# Patient Record
Sex: Male | Born: 1940
Health system: Southern US, Community
[De-identification: ages and names within clinical notes are randomized; demographics above are authoritative.]

## PROBLEM LIST (undated history)

## (undated) DIAGNOSIS — E785 Hyperlipidemia, unspecified: Secondary | ICD-10-CM

## (undated) DIAGNOSIS — I10 Essential (primary) hypertension: Secondary | ICD-10-CM

## (undated) DIAGNOSIS — M1990 Unspecified osteoarthritis, unspecified site: Secondary | ICD-10-CM

## (undated) DIAGNOSIS — F419 Anxiety disorder, unspecified: Secondary | ICD-10-CM

## (undated) DIAGNOSIS — I2510 Atherosclerotic heart disease of native coronary artery without angina pectoris: Secondary | ICD-10-CM

## (undated) DIAGNOSIS — Z87442 Personal history of urinary calculi: Secondary | ICD-10-CM

## (undated) DIAGNOSIS — G4730 Sleep apnea, unspecified: Secondary | ICD-10-CM

## (undated) DIAGNOSIS — C61 Malignant neoplasm of prostate: Secondary | ICD-10-CM

## (undated) DIAGNOSIS — M47816 Spondylosis without myelopathy or radiculopathy, lumbar region: Secondary | ICD-10-CM

## (undated) HISTORY — PX: BACK SURGERY: SHX140

## (undated) HISTORY — DX: Malignant neoplasm of prostate: C61

## (undated) HISTORY — PX: ANKLE FRACTURE SURGERY: SHX122

## (undated) HISTORY — PX: PROSTATE SURGERY: SHX751

## (undated) HISTORY — DX: Spondylosis without myelopathy or radiculopathy, lumbar region: M47.816

## (undated) HISTORY — PX: HERNIA REPAIR: SHX51

## (undated) HISTORY — PX: APPENDECTOMY: SHX54

## (undated) HISTORY — DX: Hyperlipidemia, unspecified: E78.5

## (undated) HISTORY — PX: CARDIAC CATHETERIZATION: SHX172

---

## 1998-05-15 DIAGNOSIS — C61 Malignant neoplasm of prostate: Secondary | ICD-10-CM

## 1998-05-15 HISTORY — DX: Malignant neoplasm of prostate: C61

## 1998-12-28 ENCOUNTER — Other Ambulatory Visit: Admission: RE | Admit: 1998-12-28 | Discharge: 1998-12-28 | Payer: Self-pay | Admitting: Urology

## 1999-01-26 ENCOUNTER — Encounter: Payer: Self-pay | Admitting: Urology

## 1999-01-31 ENCOUNTER — Inpatient Hospital Stay (HOSPITAL_COMMUNITY): Admission: RE | Admit: 1999-01-31 | Discharge: 1999-02-03 | Payer: Self-pay | Admitting: Urology

## 2012-10-23 DIAGNOSIS — G603 Idiopathic progressive neuropathy: Secondary | ICD-10-CM | POA: Insufficient documentation

## 2012-11-22 DIAGNOSIS — G4733 Obstructive sleep apnea (adult) (pediatric): Secondary | ICD-10-CM | POA: Insufficient documentation

## 2013-04-17 DIAGNOSIS — M48061 Spinal stenosis, lumbar region without neurogenic claudication: Secondary | ICD-10-CM | POA: Insufficient documentation

## 2014-05-26 DIAGNOSIS — H40013 Open angle with borderline findings, low risk, bilateral: Secondary | ICD-10-CM | POA: Diagnosis not present

## 2014-06-03 DIAGNOSIS — C61 Malignant neoplasm of prostate: Secondary | ICD-10-CM | POA: Diagnosis not present

## 2014-06-08 DIAGNOSIS — G4733 Obstructive sleep apnea (adult) (pediatric): Secondary | ICD-10-CM | POA: Diagnosis not present

## 2014-06-24 DIAGNOSIS — M4806 Spinal stenosis, lumbar region: Secondary | ICD-10-CM | POA: Diagnosis not present

## 2014-06-24 DIAGNOSIS — G603 Idiopathic progressive neuropathy: Secondary | ICD-10-CM | POA: Diagnosis not present

## 2014-06-24 DIAGNOSIS — G4733 Obstructive sleep apnea (adult) (pediatric): Secondary | ICD-10-CM | POA: Diagnosis not present

## 2014-07-09 DIAGNOSIS — G4733 Obstructive sleep apnea (adult) (pediatric): Secondary | ICD-10-CM | POA: Diagnosis not present

## 2014-07-27 DIAGNOSIS — C61 Malignant neoplasm of prostate: Secondary | ICD-10-CM | POA: Diagnosis not present

## 2014-07-27 DIAGNOSIS — N5201 Erectile dysfunction due to arterial insufficiency: Secondary | ICD-10-CM | POA: Diagnosis not present

## 2014-08-07 DIAGNOSIS — G4733 Obstructive sleep apnea (adult) (pediatric): Secondary | ICD-10-CM | POA: Diagnosis not present

## 2014-08-07 DIAGNOSIS — S329XXA Fracture of unspecified parts of lumbosacral spine and pelvis, initial encounter for closed fracture: Secondary | ICD-10-CM | POA: Diagnosis not present

## 2014-08-07 DIAGNOSIS — M353 Polymyalgia rheumatica: Secondary | ICD-10-CM | POA: Diagnosis not present

## 2014-08-07 DIAGNOSIS — M159 Polyosteoarthritis, unspecified: Secondary | ICD-10-CM | POA: Diagnosis not present

## 2014-08-15 DIAGNOSIS — M150 Primary generalized (osteo)arthritis: Secondary | ICD-10-CM | POA: Diagnosis not present

## 2014-08-15 DIAGNOSIS — M353 Polymyalgia rheumatica: Secondary | ICD-10-CM | POA: Diagnosis not present

## 2014-08-15 DIAGNOSIS — G4733 Obstructive sleep apnea (adult) (pediatric): Secondary | ICD-10-CM | POA: Diagnosis not present

## 2014-09-07 DIAGNOSIS — G4733 Obstructive sleep apnea (adult) (pediatric): Secondary | ICD-10-CM | POA: Diagnosis not present

## 2014-09-15 DIAGNOSIS — H6122 Impacted cerumen, left ear: Secondary | ICD-10-CM | POA: Diagnosis not present

## 2014-09-23 DIAGNOSIS — G4733 Obstructive sleep apnea (adult) (pediatric): Secondary | ICD-10-CM | POA: Diagnosis not present

## 2014-09-23 DIAGNOSIS — G603 Idiopathic progressive neuropathy: Secondary | ICD-10-CM | POA: Diagnosis not present

## 2014-09-23 DIAGNOSIS — M4806 Spinal stenosis, lumbar region: Secondary | ICD-10-CM | POA: Diagnosis not present

## 2014-10-07 DIAGNOSIS — G4733 Obstructive sleep apnea (adult) (pediatric): Secondary | ICD-10-CM | POA: Diagnosis not present

## 2014-10-14 DIAGNOSIS — M1612 Unilateral primary osteoarthritis, left hip: Secondary | ICD-10-CM | POA: Diagnosis not present

## 2014-10-29 ENCOUNTER — Ambulatory Visit (INDEPENDENT_AMBULATORY_CARE_PROVIDER_SITE_OTHER): Payer: Medicare Other | Admitting: Sports Medicine

## 2014-10-29 ENCOUNTER — Encounter: Payer: Self-pay | Admitting: Sports Medicine

## 2014-10-29 VITALS — BP 141/86 | HR 91 | Ht 68.0 in | Wt 206.0 lb

## 2014-10-29 DIAGNOSIS — M1612 Unilateral primary osteoarthritis, left hip: Secondary | ICD-10-CM | POA: Diagnosis not present

## 2014-10-29 MED ORDER — MELOXICAM 15 MG PO TABS: ORAL_TABLET | ORAL | Status: DC

## 2014-10-29 NOTE — Progress Notes (Signed)
   Subjective:    I'm seeing this patient as a consultation for:  Dr. Judie Petit [facility] family physicians  CC: Left hip pain  HPI: This is a pleasant 74 year old male, he comes in with a long history of pain that he localizes in the left groin, moderate, persistent without radiation, worse in the mornings. No trauma. He's tried oral medications including allergies except without any improvement. He does bring x-rays that show hip osteoarthritis.  Past medical history, Surgical history, Family history not pertinant except as noted below, Social history, Allergies, and medications have been entered into the medical record, reviewed, and no changes needed.   Review of Systems: No headache, visual changes, nausea, vomiting, diarrhea, constipation, dizziness, abdominal pain, skin rash, fevers, chills, night sweats, weight loss, swollen lymph nodes, body aches, joint swelling, muscle aches, chest pain, shortness of breath, mood changes, visual or auditory hallucinations.   Objective:   General: Well Developed, well nourished, and in no acute distress.  Neuro/Psych: Alert and oriented x3, extra-ocular muscles intact, able to move all 4 extremities, sensation grossly intact. Skin: Warm and dry, no rashes noted.  Respiratory: Not using accessory muscles, speaking in full sentences, trachea midline.  Cardiovascular: Pulses palpable, no extremity edema. Abdomen: Does not appear distended. Left Hip: ROM IR: 10 Deg, ER: 60 Deg, Flexion: 120 Deg, Extension: 100 Deg, Abduction: 45 Deg, Adduction: 45 Deg, internal rotation reproduces pain. Strength IR: 5/5, ER: 5/5, Flexion: 5/5, Extension: 5/5, Abduction: 5/5, Adduction: 5/5 Pelvic alignment unremarkable to inspection and palpation. Standing hip rotation and gait without trendelenburg / unsteadiness. Greater trochanter without tenderness to palpation. No tenderness over piriformis. No SI joint tenderness and normal minimal SI  movement.  Procedure: Real-time Ultrasound Guided Injection of left femoral acetabular joint Device: GE Logiq E  Verbal informed consent obtained.  Time-out conducted.  Noted no overlying erythema, induration, or other signs of local infection.  Skin prepped in a sterile fashion.  Local anesthesia: Topical Ethyl chloride.  With sterile technique and under real time ultrasound guidance:  Spinal needle advanced to the femoral head/neck tension, 1 mL every: 40, 4 mL lidocaine injected easily. Completed without difficulty  Pain immediately resolved suggesting accurate placement of the medication.  Advised to call if fevers/chills, erythema, induration, drainage, or persistent bleeding.  Images permanently stored and available for review in the ultrasound unit.  Impression: Technically successful ultrasound guided injection.  Impression and Recommendations:   This case required medical decision making of moderate complexity.

## 2014-10-29 NOTE — Assessment & Plan Note (Signed)
X-ray confirmed. Advised home rehabilitation exercises, meloxicam, he did desire femoral acetabular injection which was performed today.  Return in one month.

## 2014-11-02 ENCOUNTER — Encounter: Payer: Self-pay | Admitting: Sports Medicine

## 2014-11-07 DIAGNOSIS — G4733 Obstructive sleep apnea (adult) (pediatric): Secondary | ICD-10-CM | POA: Diagnosis not present

## 2014-11-25 ENCOUNTER — Ambulatory Visit (INDEPENDENT_AMBULATORY_CARE_PROVIDER_SITE_OTHER): Payer: Medicare Other | Admitting: Sports Medicine

## 2014-11-25 ENCOUNTER — Encounter: Payer: Self-pay | Admitting: Sports Medicine

## 2014-11-25 VITALS — BP 124/78 | HR 78 | Ht 68.0 in | Wt 207.0 lb

## 2014-11-25 DIAGNOSIS — M1612 Unilateral primary osteoarthritis, left hip: Secondary | ICD-10-CM

## 2014-11-25 NOTE — Assessment & Plan Note (Signed)
Moderate response to previous femoral acetabular injection as above. He needs a repeat injection, if insufficient response he does need a hip arthroplasty. Repeat injection today.

## 2014-11-25 NOTE — Progress Notes (Signed)
  Subjective:    CC: follow-up  HPI: This pleasant 74 year old male returns, he has primary left hip osteoarthritis, we injected the last visit and he did well, approximately 8:30 to 40% improvement in pain, he is amenable to trying a second injection today, pain is moderate, persistent and localized to the left groin, worse with weightbearing.  Past medical history, Surgical history, Family history not pertinant except as noted below, Social history, Allergies, and medications have been entered into the medical record, reviewed, and no changes needed.   Review of Systems: No fevers, chills, night sweats, weight loss, chest pain, or shortness of breath.   Objective:    General: Well Developed, well nourished, and in no acute distress.  Neuro: Alert and oriented x3, extra-ocular muscles intact, sensation grossly intact.  HEENT: Normocephalic, atraumatic, pupils equal round reactive to light, neck supple, no masses, no lymphadenopathy, thyroid nonpalpable.  Skin: Warm and dry, no rashes. Cardiac: Regular rate and rhythm, no murmurs rubs or gallops, no lower extremity edema.  Respiratory: Clear to auscultation bilaterally. Not using accessory muscles, speaking in full sentences. Left Hip: ROM IR: 20 with pain, ER: 60 Deg, Flexion: 120 Deg, Extension: 100 Deg, Abduction: 45 Deg, Adduction: 45 Deg Strength IR: 5/5, ER: 5/5, Flexion: 5/5, Extension: 5/5, Abduction: 5/5, Adduction: 5/5 Pelvic alignment unremarkable to inspection and palpation. Standing hip rotation and gait without trendelenburg / unsteadiness. Greater trochanter without tenderness to palpation. No tenderness over piriformis. No SI joint tenderness and normal minimal SI movement.  Procedure: Real-time Ultrasound Guided Injection of left femoral acetabular joint Device: GE Logiq E  Verbal informed consent obtained.  Time-out conducted.  Noted no overlying erythema, induration, or other signs of local infection.  Skin  prepped in a sterile fashion.  Local anesthesia: Topical Ethyl chloride.  With sterile technique and under real time ultrasound guidance:  Spinal needle advanced into the femoral head/neck junction, 2 mL kenalog 40, 4 mL lidocaine injected easily. Completed without difficulty  Pain immediately resolved suggesting accurate placement of the medication.  Advised to call if fevers/chills, erythema, induration, drainage, or persistent bleeding.  Images permanently stored and available for review in the ultrasound unit.  Impression: Technically successful ultrasound guided injection.  Impression and Recommendations:

## 2014-11-26 ENCOUNTER — Ambulatory Visit: Payer: Medicare Other | Admitting: Sports Medicine

## 2014-12-07 DIAGNOSIS — G4733 Obstructive sleep apnea (adult) (pediatric): Secondary | ICD-10-CM | POA: Diagnosis not present

## 2014-12-18 DIAGNOSIS — K1379 Other lesions of oral mucosa: Secondary | ICD-10-CM | POA: Diagnosis not present

## 2014-12-18 DIAGNOSIS — K1329 Other disturbances of oral epithelium, including tongue: Secondary | ICD-10-CM | POA: Diagnosis not present

## 2014-12-24 ENCOUNTER — Ambulatory Visit (INDEPENDENT_AMBULATORY_CARE_PROVIDER_SITE_OTHER): Payer: Medicare Other | Admitting: Sports Medicine

## 2014-12-24 ENCOUNTER — Encounter: Payer: Self-pay | Admitting: Sports Medicine

## 2014-12-24 DIAGNOSIS — M1612 Unilateral primary osteoarthritis, left hip: Secondary | ICD-10-CM | POA: Diagnosis not present

## 2014-12-24 MED ORDER — TRAMADOL HCL 50 MG PO TABS: ORAL_TABLET | ORAL | Status: DC

## 2014-12-24 NOTE — Assessment & Plan Note (Signed)
50% better after 2 femoroacetabular injections under ultrasound guidance. Adding tramadol, and referral to Dr. Mayer Camel to consider joint replacement

## 2014-12-24 NOTE — Progress Notes (Signed)
  Subjective:    CC: follow-up  HPI: Chad Smith has left knee osteoarthritis, we have done 2 injections into the joint,Overall he's done well, without 50% improvement in pain, he also does tramadol, gabapentin, and has done rehabilitation. Unfortunately his pain relief is not sufficient, and he is amenable at this point to consider arthroplasty.  Past medical history, Surgical history, Family history not pertinant except as noted below, Social history, Allergies, and medications have been entered into the medical record, reviewed, and no changes needed.   Review of Systems: No fevers, chills, night sweats, weight loss, chest pain, or shortness of breath.   Objective:    General: Well Developed, well nourished, and in no acute distress.  Neuro: Alert and oriented x3, extra-ocular muscles intact, sensation grossly intact.  HEENT: Normocephalic, atraumatic, pupils equal round reactive to light, neck supple, no masses, no lymphadenopathy, thyroid nonpalpable.  Skin: Warm and dry, no rashes. Cardiac: Regular rate and rhythm, no murmurs rubs or gallops, no lower extremity edema.  Respiratory: Clear to auscultation bilaterally. Not using accessory muscles, speaking in full sentences. Left hip: Tender to palpation with reproduction of pain on internal rotation, he does lack a good 30 of internal rotation, we can also reproduce his pain with flexion of the hip.  Impression and Recommendations:    I spent 40 minutes with this patient, greater than 50% was face-to-face time counseling regarding the above diagnoses, and a great deal of time discussing the natural history and progression of osteoarthritis.

## 2015-01-05 DIAGNOSIS — M1612 Unilateral primary osteoarthritis, left hip: Secondary | ICD-10-CM | POA: Diagnosis not present

## 2015-01-05 DIAGNOSIS — L439 Lichen planus, unspecified: Secondary | ICD-10-CM | POA: Diagnosis not present

## 2015-01-05 DIAGNOSIS — F5104 Psychophysiologic insomnia: Secondary | ICD-10-CM | POA: Diagnosis not present

## 2015-01-06 DIAGNOSIS — G4733 Obstructive sleep apnea (adult) (pediatric): Secondary | ICD-10-CM | POA: Diagnosis not present

## 2015-01-06 DIAGNOSIS — M150 Primary generalized (osteo)arthritis: Secondary | ICD-10-CM | POA: Diagnosis not present

## 2015-01-06 DIAGNOSIS — M353 Polymyalgia rheumatica: Secondary | ICD-10-CM | POA: Diagnosis not present

## 2015-01-07 DIAGNOSIS — G4733 Obstructive sleep apnea (adult) (pediatric): Secondary | ICD-10-CM | POA: Diagnosis not present

## 2015-01-08 DIAGNOSIS — Z125 Encounter for screening for malignant neoplasm of prostate: Secondary | ICD-10-CM | POA: Diagnosis not present

## 2015-01-11 DIAGNOSIS — L438 Other lichen planus: Secondary | ICD-10-CM | POA: Diagnosis not present

## 2015-01-19 DIAGNOSIS — L439 Lichen planus, unspecified: Secondary | ICD-10-CM | POA: Diagnosis not present

## 2015-01-21 DIAGNOSIS — M1612 Unilateral primary osteoarthritis, left hip: Secondary | ICD-10-CM | POA: Diagnosis not present

## 2015-01-26 ENCOUNTER — Other Ambulatory Visit: Payer: Self-pay | Admitting: Orthopedic Surgery

## 2015-02-01 DIAGNOSIS — C61 Malignant neoplasm of prostate: Secondary | ICD-10-CM | POA: Diagnosis not present

## 2015-02-04 DIAGNOSIS — M150 Primary generalized (osteo)arthritis: Secondary | ICD-10-CM | POA: Diagnosis not present

## 2015-02-04 DIAGNOSIS — M353 Polymyalgia rheumatica: Secondary | ICD-10-CM | POA: Diagnosis not present

## 2015-02-09 DIAGNOSIS — Z23 Encounter for immunization: Secondary | ICD-10-CM | POA: Diagnosis not present

## 2015-02-11 DIAGNOSIS — C61 Malignant neoplasm of prostate: Secondary | ICD-10-CM | POA: Diagnosis not present

## 2015-02-17 DIAGNOSIS — L438 Other lichen planus: Secondary | ICD-10-CM | POA: Diagnosis not present

## 2015-02-24 ENCOUNTER — Encounter (HOSPITAL_COMMUNITY)
Admission: RE | Admit: 2015-02-24 | Discharge: 2015-02-24 | Disposition: A | Discharge status: Home / Self Care | Payer: Medicare Other | Source: Ambulatory Visit | Attending: Orthopedic Surgery | Admitting: Orthopedic Surgery

## 2015-02-24 ENCOUNTER — Encounter (HOSPITAL_COMMUNITY): Payer: Self-pay

## 2015-02-24 DIAGNOSIS — Z01818 Encounter for other preprocedural examination: Secondary | ICD-10-CM | POA: Diagnosis not present

## 2015-02-24 DIAGNOSIS — G4730 Sleep apnea, unspecified: Secondary | ICD-10-CM | POA: Insufficient documentation

## 2015-02-24 DIAGNOSIS — M1612 Unilateral primary osteoarthritis, left hip: Secondary | ICD-10-CM | POA: Diagnosis not present

## 2015-02-24 DIAGNOSIS — I10 Essential (primary) hypertension: Secondary | ICD-10-CM | POA: Diagnosis not present

## 2015-02-24 DIAGNOSIS — Z0183 Encounter for blood typing: Secondary | ICD-10-CM | POA: Diagnosis not present

## 2015-02-24 DIAGNOSIS — Z01812 Encounter for preprocedural laboratory examination: Secondary | ICD-10-CM | POA: Insufficient documentation

## 2015-02-24 HISTORY — DX: Unspecified osteoarthritis, unspecified site: M19.90

## 2015-02-24 HISTORY — DX: Essential (primary) hypertension: I10

## 2015-02-24 HISTORY — DX: Sleep apnea, unspecified: G47.30

## 2015-02-24 HISTORY — DX: Anxiety disorder, unspecified: F41.9

## 2015-02-24 LAB — CBC WITH DIFFERENTIAL/PLATELET
BASOS PCT: 1 %
Basophils Absolute: 0 10*3/uL (ref 0.0–0.1)
EOS ABS: 0.3 10*3/uL (ref 0.0–0.7)
Eosinophils Relative: 4 %
HCT: 45.2 % (ref 39.0–52.0)
Hemoglobin: 15.4 g/dL (ref 13.0–17.0)
Lymphocytes Relative: 24 %
Lymphs Abs: 1.4 10*3/uL (ref 0.7–4.0)
MCH: 30.9 pg (ref 26.0–34.0)
MCHC: 34.1 g/dL (ref 30.0–36.0)
MCV: 90.6 fL (ref 78.0–100.0)
MONO ABS: 0.5 10*3/uL (ref 0.1–1.0)
MONOS PCT: 9 %
NEUTROS PCT: 62 %
Neutro Abs: 3.8 10*3/uL (ref 1.7–7.7)
PLATELETS: 245 10*3/uL (ref 150–400)
RBC: 4.99 MIL/uL (ref 4.22–5.81)
RDW: 13.6 % (ref 11.5–15.5)
WBC: 6.1 10*3/uL (ref 4.0–10.5)

## 2015-02-24 LAB — SURGICAL PCR SCREEN
MRSA, PCR: NEGATIVE
STAPHYLOCOCCUS AUREUS: NEGATIVE

## 2015-02-24 LAB — URINALYSIS, ROUTINE W REFLEX MICROSCOPIC
BILIRUBIN URINE: NEGATIVE
Glucose, UA: NEGATIVE mg/dL
HGB URINE DIPSTICK: NEGATIVE
KETONES UR: NEGATIVE mg/dL
Leukocytes, UA: NEGATIVE
Nitrite: NEGATIVE
PROTEIN: NEGATIVE mg/dL
Specific Gravity, Urine: 1.016 (ref 1.005–1.030)
UROBILINOGEN UA: 0.2 mg/dL (ref 0.0–1.0)
pH: 7.5 (ref 5.0–8.0)

## 2015-02-24 LAB — BASIC METABOLIC PANEL
Anion gap: 8 (ref 5–15)
BUN: 12 mg/dL (ref 6–20)
CALCIUM: 9.5 mg/dL (ref 8.9–10.3)
CO2: 28 mmol/L (ref 22–32)
CREATININE: 0.96 mg/dL (ref 0.61–1.24)
Chloride: 99 mmol/L — ABNORMAL LOW (ref 101–111)
GFR calc non Af Amer: >60 mL/min (ref >60)
GLUCOSE: 98 mg/dL (ref 65–99)
Potassium: 4.4 mmol/L (ref 3.5–5.1)
Sodium: 135 mmol/L (ref 135–145)

## 2015-02-24 LAB — TYPE AND SCREEN
ABO/RH(D): A POS
ANTIBODY SCREEN: NEGATIVE

## 2015-02-24 LAB — PROTIME-INR
INR: 0.94 (ref 0.00–1.49)
PROTHROMBIN TIME: 12.8 s (ref 11.6–15.2)

## 2015-02-24 LAB — ABO/RH: ABO/RH(D): A POS

## 2015-02-24 LAB — APTT: aPTT: 27 seconds (ref 24–37)

## 2015-03-02 NOTE — H&P (Signed)
TOTAL HIP ADMISSION H&P  Patient is admitted for left total hip arthroplasty.  Subjective:  Chief Complaint: left hip pain  HPI: Chad Smith, 74 y.o. male, has a history of pain and functional disability in the left hip(s) due to arthritis and patient has failed non-surgical conservative treatments for greater than 12 weeks to include NSAID's and/or analgesics, corticosteriod injections, flexibility and strengthening excercises, weight reduction as appropriate and activity modification.  Onset of symptoms was gradual starting 3 years ago with gradually worsening course since that time.The patient noted no past surgery on the left hip(s).  Patient currently rates pain in the left hip at 10 out of 10 with activity. Patient has night pain, worsening of pain with activity and weight bearing, pain that interfers with activities of daily living and pain with passive range of motion. Patient has evidence of subchondral cysts and joint space narrowing by imaging studies. This condition presents safety issues increasing the risk of falls.  There is no current active infection.  Patient Active Problem List   Diagnosis Date Noted  . Primary osteoarthritis of left hip 10/29/2014   Past Medical History  Diagnosis Date  . Hypertension   . Arthritis   . Sleep apnea     v pap  . Anxiety     Past Surgical History  Procedure Laterality Date  . Back surgery    . Hernia repair    . Appendectomy    . Ankle fracture surgery    . Prostate surgery      No prescriptions prior to admission   No Known Allergies  Social History  Substance Use Topics  . Smoking status: Never Smoker   . Smokeless tobacco: Not on file  . Alcohol Use: 0.0 oz/week    0 Standard drinks or equivalent per week     Comment: occ    No family history on file.   Review of Systems  Constitutional: Negative.   HENT: Negative.   Eyes: Negative.   Respiratory: Negative.   Cardiovascular: Negative.   Gastrointestinal:  Negative.   Genitourinary: Negative.   Musculoskeletal: Positive for joint pain.  Skin: Negative.   Neurological: Negative.   Endo/Heme/Allergies: Negative.   Psychiatric/Behavioral: Negative.     Objective:  Physical Exam  Constitutional: He is oriented to person, place, and time. He appears well-developed and well-nourished.  HENT:  Head: Normocephalic and atraumatic.  Eyes: Pupils are equal, round, and reactive to light.  Neck: Normal range of motion. Neck supple.  Cardiovascular: Intact distal pulses.   Respiratory: Effort normal.  Musculoskeletal: He exhibits tenderness.  the patient's right hip has good strength good range of motion and no pain.  Patient's left hip has obvious irritability with hip flexion as well as internal rotation.  Severely limited internal rotation to approximately 10-15.  Pain with palpation of the groin and lateral hip.  Positive heel bump.  Patient's calves are soft and nontender.  He is neurovascularly intact distally.  Neurological: He is alert and oriented to person, place, and time.  Skin: Skin is warm and dry.  Psychiatric: He has a normal mood and affect. His behavior is normal. Judgment and thought content normal.    Vital signs in last 24 hours:    Labs:   Estimated body mass index is 31.48 kg/(m^2) as calculated from the following:   Height as of 11/25/14: 5\' 8"  (1.727 m).   Weight as of 12/24/14: 93.895 kg (207 lb).   Imaging Review Plain radiographs demonstrate end-stage  arthritis of the left hip with large subchondral cysts superiorly.  Assessment/Plan:  End stage arthritis, left hip(s)  The patient history, physical examination, clinical judgement of the provider and imaging studies are consistent with end stage degenerative joint disease of the left hip(s) and total hip arthroplasty is deemed medically necessary. The treatment options including medical management, injection therapy, arthroscopy and arthroplasty were discussed at  length. The risks and benefits of total hip arthroplasty were presented and reviewed. The risks due to aseptic loosening, infection, stiffness, dislocation/subluxation,  thromboembolic complications and other imponderables were discussed.  The patient acknowledged the explanation, agreed to proceed with the plan and consent was signed. Patient is being admitted for inpatient treatment for surgery, pain control, PT, OT, prophylactic antibiotics, VTE prophylaxis, progressive ambulation and ADL's and discharge planning.The patient is planning to be discharged home with home health services

## 2015-03-05 NOTE — Progress Notes (Signed)
Patient notified of time change and informed to arrive to [facility] day of surgery at 7:30.  Patient verbalized understanding.

## 2015-03-07 MED ORDER — CEFAZOLIN SODIUM-DEXTROSE 2-3 GM-% IV SOLR
2.0000 g | INTRAVENOUS | Status: AC
  Administered 2015-03-08: 2 g via INTRAVENOUS
  Filled 2015-03-07: qty 50

## 2015-03-07 MED ORDER — CHLORHEXIDINE GLUCONATE 4 % EX LIQD: 60.0000 mL | Freq: Once | CUTANEOUS | Status: DC

## 2015-03-07 MED ORDER — DEXTROSE-NACL 5-0.45 % IV SOLN: INTRAVENOUS | Status: DC

## 2015-03-08 ENCOUNTER — Encounter (HOSPITAL_COMMUNITY): Payer: Self-pay | Admitting: Surgery

## 2015-03-08 ENCOUNTER — Inpatient Hospital Stay (HOSPITAL_COMMUNITY): Payer: Medicare Other | Admitting: Anesthesiology

## 2015-03-08 ENCOUNTER — Inpatient Hospital Stay (HOSPITAL_COMMUNITY): Payer: Medicare Other

## 2015-03-08 ENCOUNTER — Inpatient Hospital Stay (HOSPITAL_COMMUNITY)
Admission: RE | Admit: 2015-03-08 | Discharge: 2015-03-10 | DRG: 470 | Disposition: A | Discharge status: Home Health | Payer: Medicare Other | Source: Ambulatory Visit | Attending: Orthopedic Surgery | Admitting: Orthopedic Surgery

## 2015-03-08 ENCOUNTER — Encounter (HOSPITAL_COMMUNITY)
Admission: RE | Disposition: A | Discharge status: Home Health | Payer: Self-pay | Source: Ambulatory Visit | Attending: Orthopedic Surgery

## 2015-03-08 DIAGNOSIS — F419 Anxiety disorder, unspecified: Secondary | ICD-10-CM | POA: Diagnosis present

## 2015-03-08 DIAGNOSIS — G4730 Sleep apnea, unspecified: Secondary | ICD-10-CM | POA: Diagnosis not present

## 2015-03-08 DIAGNOSIS — Z96649 Presence of unspecified artificial hip joint: Secondary | ICD-10-CM

## 2015-03-08 DIAGNOSIS — M1612 Unilateral primary osteoarthritis, left hip: Secondary | ICD-10-CM | POA: Diagnosis not present

## 2015-03-08 DIAGNOSIS — Z419 Encounter for procedure for purposes other than remedying health state, unspecified: Secondary | ICD-10-CM

## 2015-03-08 DIAGNOSIS — Z9889 Other specified postprocedural states: Secondary | ICD-10-CM | POA: Diagnosis not present

## 2015-03-08 DIAGNOSIS — I10 Essential (primary) hypertension: Secondary | ICD-10-CM | POA: Diagnosis present

## 2015-03-08 DIAGNOSIS — M169 Osteoarthritis of hip, unspecified: Secondary | ICD-10-CM | POA: Diagnosis not present

## 2015-03-08 DIAGNOSIS — M25552 Pain in left hip: Secondary | ICD-10-CM | POA: Diagnosis present

## 2015-03-08 DIAGNOSIS — D62 Acute posthemorrhagic anemia: Secondary | ICD-10-CM | POA: Diagnosis not present

## 2015-03-08 DIAGNOSIS — M1610 Unilateral primary osteoarthritis, unspecified hip: Secondary | ICD-10-CM | POA: Diagnosis present

## 2015-03-08 DIAGNOSIS — Z96642 Presence of left artificial hip joint: Secondary | ICD-10-CM | POA: Diagnosis not present

## 2015-03-08 HISTORY — PX: TOTAL HIP ARTHROPLASTY: SHX124

## 2015-03-08 SURGERY — ARTHROPLASTY, HIP, TOTAL, ANTERIOR APPROACH
Anesthesia: Spinal | Site: Hip | Laterality: Left

## 2015-03-08 MED ORDER — PROMETHAZINE HCL 25 MG/ML IJ SOLN: 6.2500 mg | INTRAMUSCULAR | Status: DC | PRN

## 2015-03-08 MED ORDER — FENTANYL CITRATE (PF) 100 MCG/2ML IJ SOLN
INTRAMUSCULAR | Status: DC | PRN
Start: 2015-03-08 — End: 2015-03-08
  Administered 2015-03-08: 50 ug via INTRAVENOUS

## 2015-03-08 MED ORDER — ASPIRIN EC 325 MG PO TBEC
325.0000 mg | DELAYED_RELEASE_TABLET | Freq: Every day | ORAL | Status: DC
  Administered 2015-03-09 – 2015-03-10 (×2): 325 mg via ORAL
  Filled 2015-03-08 (×2): qty 1

## 2015-03-08 MED ORDER — ASPIRIN EC 325 MG PO TBEC: 325.0000 mg | DELAYED_RELEASE_TABLET | Freq: Two times a day (BID) | ORAL | Status: DC

## 2015-03-08 MED ORDER — PROPOFOL 10 MG/ML IV BOLUS
INTRAVENOUS | Status: AC
  Filled 2015-03-08: qty 20

## 2015-03-08 MED ORDER — EZETIMIBE 10 MG PO TABS
10.0000 mg | ORAL_TABLET | Freq: Every day | ORAL | Status: DC
  Administered 2015-03-08 – 2015-03-10 (×3): 10 mg via ORAL
  Filled 2015-03-08 (×3): qty 1

## 2015-03-08 MED ORDER — PROPOFOL 500 MG/50ML IV EMUL
INTRAVENOUS | Status: DC | PRN
  Administered 2015-03-08: 50 ug/kg/min via INTRAVENOUS

## 2015-03-08 MED ORDER — METHOCARBAMOL 1000 MG/10ML IJ SOLN: 500.0000 mg | Freq: Four times a day (QID) | INTRAVENOUS | Status: DC | PRN

## 2015-03-08 MED ORDER — HYDROMORPHONE HCL 1 MG/ML IJ SOLN
INTRAMUSCULAR | Status: AC
  Filled 2015-03-08: qty 1

## 2015-03-08 MED ORDER — LOSARTAN POTASSIUM 50 MG PO TABS
100.0000 mg | ORAL_TABLET | Freq: Every day | ORAL | Status: DC
  Administered 2015-03-08 – 2015-03-10 (×3): 100 mg via ORAL
  Filled 2015-03-08 (×4): qty 2

## 2015-03-08 MED ORDER — FENTANYL CITRATE (PF) 250 MCG/5ML IJ SOLN
INTRAMUSCULAR | Status: AC
  Filled 2015-03-08: qty 5

## 2015-03-08 MED ORDER — SODIUM CHLORIDE 0.9 % IV SOLN
1000.0000 mg | INTRAVENOUS | Status: AC
Start: 2015-03-08 — End: 2015-03-08
  Administered 2015-03-08: 1000 mg via INTRAVENOUS
  Filled 2015-03-08: qty 10

## 2015-03-08 MED ORDER — ONDANSETRON HCL 4 MG/2ML IJ SOLN
INTRAMUSCULAR | Status: AC
  Filled 2015-03-08: qty 2

## 2015-03-08 MED ORDER — LACTATED RINGERS IV SOLN
INTRAVENOUS | Status: DC
  Administered 2015-03-08 (×2): via INTRAVENOUS

## 2015-03-08 MED ORDER — ONDANSETRON HCL 4 MG PO TABS
4.0000 mg | ORAL_TABLET | Freq: Four times a day (QID) | ORAL | Status: DC | PRN
  Administered 2015-03-09 – 2015-03-10 (×2): 4 mg via ORAL
  Filled 2015-03-08 (×3): qty 1

## 2015-03-08 MED ORDER — PHENYLEPHRINE HCL 10 MG/ML IJ SOLN
INTRAMUSCULAR | Status: DC | PRN
  Administered 2015-03-08: 120 ug via INTRAVENOUS
  Administered 2015-03-08: 80 ug via INTRAVENOUS

## 2015-03-08 MED ORDER — SIMVASTATIN 40 MG PO TABS
40.0000 mg | ORAL_TABLET | Freq: Every day | ORAL | Status: DC
  Administered 2015-03-08 – 2015-03-10 (×3): 40 mg via ORAL
  Filled 2015-03-08 (×3): qty 1

## 2015-03-08 MED ORDER — METOCLOPRAMIDE HCL 5 MG PO TABS
5.0000 mg | ORAL_TABLET | Freq: Three times a day (TID) | ORAL | Status: DC | PRN
  Administered 2015-03-09 – 2015-03-10 (×2): 10 mg via ORAL
  Filled 2015-03-08 (×2): qty 2

## 2015-03-08 MED ORDER — BUPIVACAINE HCL (PF) 0.5 % IJ SOLN
INTRAMUSCULAR | Status: DC | PRN
  Administered 2015-03-08: 3 mL via INTRATHECAL

## 2015-03-08 MED ORDER — OXYCODONE-ACETAMINOPHEN 5-325 MG PO TABS: 1.0000 | ORAL_TABLET | ORAL | Status: DC | PRN

## 2015-03-08 MED ORDER — LATANOPROST 0.005 % OP SOLN
1.0000 [drp] | Freq: Every day | OPHTHALMIC | Status: DC
  Administered 2015-03-08 – 2015-03-09 (×2): 1 [drp] via OPHTHALMIC
  Filled 2015-03-08: qty 2.5

## 2015-03-08 MED ORDER — PHENYLEPHRINE HCL 10 MG/ML IJ SOLN
10.0000 mg | INTRAMUSCULAR | Status: DC | PRN
  Administered 2015-03-08: 40 ug/min via INTRAVENOUS

## 2015-03-08 MED ORDER — HYDROMORPHONE HCL 1 MG/ML IJ SOLN
0.5000 mg | INTRAMUSCULAR | Status: DC | PRN
  Administered 2015-03-09 (×2): 1 mg via INTRAVENOUS
  Filled 2015-03-08 (×2): qty 1

## 2015-03-08 MED ORDER — TIZANIDINE HCL 2 MG PO TABS: 2.0000 mg | ORAL_TABLET | Freq: Four times a day (QID) | ORAL | Status: DC | PRN

## 2015-03-08 MED ORDER — OXYCODONE HCL 5 MG PO TABS
ORAL_TABLET | ORAL | Status: AC
  Filled 2015-03-08: qty 2

## 2015-03-08 MED ORDER — LIDOCAINE HCL (CARDIAC) 20 MG/ML IV SOLN
INTRAVENOUS | Status: AC
  Filled 2015-03-08: qty 5

## 2015-03-08 MED ORDER — METHOCARBAMOL 500 MG PO TABS
500.0000 mg | ORAL_TABLET | Freq: Four times a day (QID) | ORAL | Status: DC | PRN
Start: 2015-03-08 — End: 2015-03-10
  Filled 2015-03-08: qty 1

## 2015-03-08 MED ORDER — EPHEDRINE SULFATE 50 MG/ML IJ SOLN
INTRAMUSCULAR | Status: DC | PRN
  Administered 2015-03-08: 10 mg via INTRAVENOUS
  Administered 2015-03-08: 15 mg via INTRAVENOUS

## 2015-03-08 MED ORDER — GABAPENTIN 300 MG PO CAPS
900.0000 mg | ORAL_CAPSULE | ORAL | Status: AC
  Administered 2015-03-08: 900 mg via ORAL
  Filled 2015-03-08: qty 3

## 2015-03-08 MED ORDER — KCL IN DEXTROSE-NACL 20-5-0.45 MEQ/L-%-% IV SOLN
INTRAVENOUS | Status: DC
  Administered 2015-03-08: 19:00:00 via INTRAVENOUS
  Filled 2015-03-08: qty 1000

## 2015-03-08 MED ORDER — ONDANSETRON HCL 4 MG/2ML IJ SOLN
4.0000 mg | Freq: Four times a day (QID) | INTRAMUSCULAR | Status: DC | PRN
  Administered 2015-03-08 – 2015-03-10 (×2): 4 mg via INTRAVENOUS
  Filled 2015-03-08 (×2): qty 2

## 2015-03-08 MED ORDER — MIDAZOLAM HCL 5 MG/5ML IJ SOLN
INTRAMUSCULAR | Status: DC | PRN
  Administered 2015-03-08: 2 mg via INTRAVENOUS

## 2015-03-08 MED ORDER — TRAVOPROST 0.004 % OP SOLN: 1.0000 [drp] | Freq: Every day | OPHTHALMIC | Status: DC

## 2015-03-08 MED ORDER — ACETAMINOPHEN 325 MG PO TABS
650.0000 mg | ORAL_TABLET | Freq: Four times a day (QID) | ORAL | Status: DC | PRN
  Administered 2015-03-10: 650 mg via ORAL
  Filled 2015-03-08: qty 2

## 2015-03-08 MED ORDER — GABAPENTIN 600 MG PO TABS
900.0000 mg | ORAL_TABLET | Freq: Four times a day (QID) | ORAL | Status: DC | PRN
  Filled 2015-03-08: qty 1.5

## 2015-03-08 MED ORDER — DEXAMETHASONE SODIUM PHOSPHATE 10 MG/ML IJ SOLN
10.0000 mg | Freq: Once | INTRAMUSCULAR | Status: AC
  Administered 2015-03-09: 10 mg via INTRAVENOUS
  Filled 2015-03-08: qty 1

## 2015-03-08 MED ORDER — METHOCARBAMOL 1000 MG/10ML IJ SOLN
500.0000 mg | INTRAMUSCULAR | Status: AC
  Administered 2015-03-08: 500 mg via INTRAVENOUS
  Filled 2015-03-08: qty 5

## 2015-03-08 MED ORDER — PHENYLEPHRINE 40 MCG/ML (10ML) SYRINGE FOR IV PUSH (FOR BLOOD PRESSURE SUPPORT)
PREFILLED_SYRINGE | INTRAVENOUS | Status: AC
  Filled 2015-03-08: qty 10

## 2015-03-08 MED ORDER — ACETAMINOPHEN 650 MG RE SUPP: 650.0000 mg | Freq: Four times a day (QID) | RECTAL | Status: DC | PRN

## 2015-03-08 MED ORDER — OXYMETAZOLINE HCL 0.05 % NA SOLN
1.0000 | Freq: Two times a day (BID) | NASAL | Status: DC | PRN
  Filled 2015-03-08: qty 15

## 2015-03-08 MED ORDER — METOCLOPRAMIDE HCL 5 MG/ML IJ SOLN
5.0000 mg | Freq: Three times a day (TID) | INTRAMUSCULAR | Status: DC | PRN
  Administered 2015-03-09: 10 mg via INTRAVENOUS
  Filled 2015-03-08: qty 2

## 2015-03-08 MED ORDER — HYDROMORPHONE HCL 1 MG/ML IJ SOLN
0.2500 mg | INTRAMUSCULAR | Status: DC | PRN
  Administered 2015-03-08 (×4): 0.5 mg via INTRAVENOUS

## 2015-03-08 MED ORDER — ALUMINUM HYDROXIDE GEL 320 MG/5ML PO SUSP
15.0000 mL | ORAL | Status: DC | PRN
  Administered 2015-03-09: 30 mL via ORAL
  Filled 2015-03-08 (×3): qty 30

## 2015-03-08 MED ORDER — MENTHOL 3 MG MT LOZG: 1.0000 | LOZENGE | OROMUCOSAL | Status: DC | PRN

## 2015-03-08 MED ORDER — OXYCODONE HCL 5 MG PO TABS
5.0000 mg | ORAL_TABLET | ORAL | Status: DC | PRN
  Administered 2015-03-08 – 2015-03-10 (×7): 10 mg via ORAL
  Filled 2015-03-08 (×7): qty 2

## 2015-03-08 MED ORDER — DIPHENHYDRAMINE HCL 12.5 MG/5ML PO ELIX: 12.5000 mg | ORAL_SOLUTION | ORAL | Status: DC | PRN

## 2015-03-08 MED ORDER — 0.9 % SODIUM CHLORIDE (POUR BTL) OPTIME
TOPICAL | Status: DC | PRN
  Administered 2015-03-08: 1000 mL

## 2015-03-08 MED ORDER — PHENOL 1.4 % MT LIQD: 1.0000 | OROMUCOSAL | Status: DC | PRN

## 2015-03-08 MED ORDER — MIDAZOLAM HCL 2 MG/2ML IJ SOLN
INTRAMUSCULAR | Status: AC
  Filled 2015-03-08: qty 4

## 2015-03-08 SURGICAL SUPPLY — 50 items
BLADE SAW SGTL 18X1.27X75 (BLADE) ×2 IMPLANT
BLADE SAW SGTL 18X1.27X75MM (BLADE) ×1
BLADE SURG ROTATE 9660 (MISCELLANEOUS) IMPLANT
CAPT HIP TOTAL 2 ×3 IMPLANT
CELLS DAT CNTRL 66122 CELL SVR (MISCELLANEOUS) ×1 IMPLANT
COVER PERINEAL POST (MISCELLANEOUS) ×3 IMPLANT
COVER SURGICAL LIGHT HANDLE (MISCELLANEOUS) ×3 IMPLANT
DRAPE C-ARM 42X72 X-RAY (DRAPES) ×3 IMPLANT
DRAPE IMP U-DRAPE 54X76 (DRAPES) ×3 IMPLANT
DRAPE STERI IOBAN 125X83 (DRAPES) ×3 IMPLANT
DRAPE U-SHAPE 47X51 STRL (DRAPES) ×9 IMPLANT
DRSG AQUACEL AG ADV 3.5X10 (GAUZE/BANDAGES/DRESSINGS) ×3 IMPLANT
DURAPREP 26ML APPLICATOR (WOUND CARE) ×3 IMPLANT
ELECT BLADE 4.0 EZ CLEAN MEGAD (MISCELLANEOUS)
ELECT CAUTERY BLADE 6.4 (BLADE) ×3 IMPLANT
ELECT REM PT RETURN 9FT ADLT (ELECTROSURGICAL) ×3
ELECTRODE BLDE 4.0 EZ CLN MEGD (MISCELLANEOUS) IMPLANT
ELECTRODE REM PT RTRN 9FT ADLT (ELECTROSURGICAL) ×1 IMPLANT
FACESHIELD WRAPAROUND (MASK) ×6 IMPLANT
GLOVE BIO SURGEON STRL SZ8 (GLOVE) ×6 IMPLANT
GLOVE BIOGEL PI IND STRL 8 (GLOVE) ×2 IMPLANT
GLOVE BIOGEL PI INDICATOR 8 (GLOVE) ×4
GOWN STRL REUS W/ TWL LRG LVL3 (GOWN DISPOSABLE) ×1 IMPLANT
GOWN STRL REUS W/ TWL XL LVL3 (GOWN DISPOSABLE) ×2 IMPLANT
GOWN STRL REUS W/TWL LRG LVL3 (GOWN DISPOSABLE) ×2
GOWN STRL REUS W/TWL XL LVL3 (GOWN DISPOSABLE) ×4
KIT BASIN OR (CUSTOM PROCEDURE TRAY) ×3 IMPLANT
KIT ROOM TURNOVER OR (KITS) ×3 IMPLANT
LINER BOOT UNIVERSAL DISP (MISCELLANEOUS) ×3 IMPLANT
MANIFOLD NEPTUNE II (INSTRUMENTS) ×3 IMPLANT
NS IRRIG 1000ML POUR BTL (IV SOLUTION) ×3 IMPLANT
PACK TOTAL JOINT (CUSTOM PROCEDURE TRAY) ×3 IMPLANT
PACK UNIVERSAL I (CUSTOM PROCEDURE TRAY) ×3 IMPLANT
PAD ARMBOARD 7.5X6 YLW CONV (MISCELLANEOUS) ×6 IMPLANT
RTRCTR WOUND ALEXIS 18CM MED (MISCELLANEOUS) ×3
STAPLER VISISTAT 35W (STAPLE) ×3 IMPLANT
SUT ETHIBOND NAB CT1 #1 30IN (SUTURE) ×9 IMPLANT
SUT VIC AB 0 CT1 27 (SUTURE)
SUT VIC AB 0 CT1 27XBRD ANBCTR (SUTURE) IMPLANT
SUT VIC AB 1 CT1 27 (SUTURE) ×2
SUT VIC AB 1 CT1 27XBRD ANBCTR (SUTURE) ×1 IMPLANT
SUT VIC AB 2-0 CT1 27 (SUTURE) ×2
SUT VIC AB 2-0 CT1 TAPERPNT 27 (SUTURE) ×1 IMPLANT
SUT VIC AB 3-0 CT1 27 (SUTURE) ×2
SUT VIC AB 3-0 CT1 TAPERPNT 27 (SUTURE) ×1 IMPLANT
SUT VLOC 180 0 24IN GS25 (SUTURE) ×3 IMPLANT
TOWEL OR 17X24 6PK STRL BLUE (TOWEL DISPOSABLE) ×3 IMPLANT
TOWEL OR 17X26 10 PK STRL BLUE (TOWEL DISPOSABLE) ×6 IMPLANT
TRAY FOLEY CATH 14FR (SET/KITS/TRAYS/PACK) IMPLANT
WATER STERILE IRR 1000ML POUR (IV SOLUTION) ×6 IMPLANT

## 2015-03-08 NOTE — Interval H&P Note (Signed)
History and Physical Interval Note:  03/08/2015 10:14 AM  Chad Smith  has presented today for surgery, with the diagnosis of LEFT HIP OSTEOARTHRITIS  The various methods of treatment have been discussed with the patient and family. After consideration of risks, benefits and other options for treatment, the patient has consented to  Procedure(s): TOTAL HIP ARTHROPLASTY ANTERIOR APPROACH (Left) as a surgical intervention .  The patient's history has been reviewed, patient examined, no change in status, stable for surgery.  I have reviewed the patient's chart and labs.  Questions were answered to the patient's satisfaction.     Kerin Salen

## 2015-03-08 NOTE — Transfer of Care (Signed)
2Immediate Anesthesia Transfer of Care Note  Patient: Chad Smith  Procedure(s) Performed: Procedure(s): TOTAL HIP ARTHROPLASTY ANTERIOR APPROACH (Left)  Patient Location: PACU  Anesthesia Type:MAC and Spinal  Level of Consciousness: awake, alert , oriented and patient cooperative  Airway & Oxygen Therapy: Patient Spontanous Breathing and Patient connected to nasal cannula oxygen  Post-op Assessment: Report given to RN, Post -op Vital signs reviewed and stable and Patient moving all extremities X 4  Post vital signs: Reviewed and stable  Last Vitals:  Filed Vitals:   03/08/15 0730  BP: 144/73  Pulse: 79  Temp: 36.1 C  Resp: 97    Complications: No apparent anesthesia complications

## 2015-03-08 NOTE — Op Note (Signed)
OPERATIVE REPORT    DATE OF PROCEDURE:  03/08/2015       PREOPERATIVE DIAGNOSIS:  LEFT HIP OSTEOARTHRITIS                                                          POSTOPERATIVE DIAGNOSIS:  LEFT HIP OSTEOARTHRITIS                                                           PROCEDURE: Anterior L total hip arthroplasty using a 54 mm DePuy Pinnacle  Cup, [employer], 0-degree polyethylene liner, a +0 36 mm ceramic head, a 9 Depuy Triloc stem   SURGEON: Jayjay Littles J    ASSISTANT:   [employer]  (present throughout entire procedure and necessary for timely completion of the procedure)   ANESTHESIA: Spinal BLOOD LOSS: 500 FLUID REPLACEMENT: 1600 crystalloid Antibiotic: 2gm ancef Tranexamic Acid: 1gm iv COMPLICATIONS: none    INDICATIONS FOR PROCEDURE: A 74 y.o. year-old With  LEFT HIP OSTEOARTHRITIS   for 5 years, x-rays show bone-on-bone arthritic changes. Despite conservative measures with observation, anti-inflammatory medicine, narcotics, use of a cane, has severe unremitting pain and can ambulate only a few blocks before resting. Patient desires elective L total hip arthroplasty to decrease pain and increase function. The risks, benefits, and alternatives were discussed at length including but not limited to the risks of infection, bleeding, nerve injury, stiffness, blood clots, the need for revision surgery, cardiopulmonary complications, among others, and they were willing to proceed. Questions answered     PROCEDURE IN DETAIL: The patient was identified by armband,  received preoperative IV antibiotics in the holding area at [facility], taken to the operating room , appropriate anesthetic monitors  were attached and spinal anesthesia was induced with the patienton the gurney. The HANA boots were applied to the feet and he was then transferred to the HANA table with a peroneal post and support underneath the right leg which was locked in extension. The  left lower extremity was then prepped and draped in the usual sterile fashion from just above the iliac crest to the knee. And a timeout procedure was performed. We then made a 12 cm incision along the interval at the leading edge of the tensor fascia lata of starting at 2 cm lateral to and 2 cm distal to the ASIS. Small bleeders in the skin and subcutaneous tissue identified and cauterized we dissected down to the fascia and made an incision in the fascia allowing [place] to elevate the fascia of the tensor muscle and exploited the interval between the rectus and the tensor fascia lot of. A Hohmann retractor was then placed along the superior neck of the femur and a Cobra retractor along the inferior neck of the femur we teed the capsule starting out at the superior anterior aspect of the acetabulum going distally and made the T along the neck both leaflets of the T were tagged with #2 Ethibond suture. Cobra retractors were then placed along the inferior and superior neck allowing [place] to perform a standard neck cut and removed the femoral head with  a power corkscrew. We then placed a right angle Hohmann retractor along the anterior aspect of the acetabulum a spiked Cobra in the cotyloid notch and posteriorly another spiked Cobra. We then sequentially reamed up to a 53 mm basket reamer obtaining good coverage in all quadrants and this is verified by C-arm imaging. Under C-arm control with and hammered into place a 54 mm Gryption sector cup in 45 of abduction and 15 of anteversion. The cup seated nicely and required no supplemental screws. We then placed  a 0 polyethylene liner to accept a 36 mm head. The foot was then externally rotated to 100, the HANA elevator was placed around the flare of the greater trochanter and the limb was extended and abducted delivering the proximal femur up into the wound. A medium Hohmann retractor was placed over the greater trochanter and a Mueller retractor along the posterior femoral  neck completing the exposure. We then performed releases superiorly and and inferiorly of the capsule going back to the pirformis fossa superiorly and to the lesser trochanter inferiorly. We then entered the proximal femur with the box cutting offset chisel followed by the chili pepper and broaching up to a 9 broach. This seated nicely and we reamed the calcar. A trial reduction was performed with a +5 mm 36 mm head  trial reduction was performed the limb lengths were excellent the hip was stable in 90 of external rotation. At this point the trial components removed and we hammered into place a #9 Tri-Lock stem with Gryption coating. This is a high offset stem and a +5 36 mm ceramic ball was then hammered into place the hip was reduced and final C-arm images obtained. The wound is thoroughly irrigated with normal saline solution we then packed the sponge with [employer] a casted into the wound and closed the fascia of the tensor fascia lot a with running 2-0 vicryl suture the subcutaneous tissue was closed with 2-0 and 3-0 Vicryl suture followed by an Aquasol dressing. At this point the patient was transferred to hospital gurney without difficulty. The subcutaneous tissue with 2-0 undyed Vicryl suture and the skin with running  3-0 vicryl subcuticular suture. Aquacil dressing was applied. The patient was then unclamped, rolled supine, awaken extubated and taken to recovery room without difficulty in stable condition.   Nykolas Bacallao J 03/08/2015, 1:08 PM

## 2015-03-08 NOTE — Anesthesia Procedure Notes (Addendum)
Spinal Patient location during procedure: OR Staffing Performed by: anesthesiologist  Preanesthetic Checklist Completed: patient identified, site marked, surgical consent, pre-op evaluation, timeout performed, IV checked, risks and benefits discussed and monitors and equipment checked Spinal Block Patient position: sitting Prep: Betadine Patient monitoring: heart rate, continuous pulse ox and blood pressure Location: L3-4 Injection technique: single-shot Needle Needle type: Sprotte  Needle gauge: 24 G Needle length: 9 cm Additional Notes Expiration date of kit checked and confirmed. Patient tolerated procedure well, without complications.    Date/Time: 03/08/2015 11:00 AM Performed by: Carney Living Oxygen Delivery Method: Nasal cannula    Date/Time: 03/08/2015 11:27 AM Performed by: Kerrie Pleasure P Oxygen Delivery Method: Simple face mask

## 2015-03-08 NOTE — Anesthesia Preprocedure Evaluation (Addendum)
Anesthesia Evaluation  Patient identified by MRN, date of birth, ID band Patient awake    Reviewed: Allergy & Precautions, NPO status , Patient's Chart, lab work & pertinent test results  Airway Mallampati: II  TM Distance: >3 FB Neck ROM: Full    Dental no notable dental hx. (+) Teeth Intact   Pulmonary sleep apnea and Continuous Positive Airway Pressure Ventilation ,    Pulmonary exam normal breath sounds clear to auscultation       Cardiovascular hypertension, Pt. on medications Normal cardiovascular exam Rhythm:Regular Rate:Normal     Neuro/Psych Anxiety negative neurological ROS  negative psych ROS   GI/Hepatic negative GI ROS, Neg liver ROS,   Endo/Other  negative endocrine ROS  Renal/GU negative Renal ROS  negative genitourinary   Musculoskeletal negative musculoskeletal ROS (+) Arthritis , Osteoarthritis,    Abdominal   Peds negative pediatric ROS (+)  Hematology negative hematology ROS (+)   Anesthesia Other Findings   Reproductive/Obstetrics negative OB ROS                           Anesthesia Physical Anesthesia Plan  ASA: II  Anesthesia Plan: Spinal   Post-op Pain Management:    Induction: Intravenous  Airway Management Planned: Simple Face Mask  Additional Equipment:   Intra-op Plan:   Post-operative Plan:   Informed Consent: I have reviewed the patients History and Physical, chart, labs and discussed the procedure including the risks, benefits and alternatives for the proposed anesthesia with the patient or authorized representative who has indicated his/her understanding and acceptance.   Dental advisory given  Plan Discussed with: CRNA, Surgeon and Anesthesiologist  Anesthesia Plan Comments:        Anesthesia Quick Evaluation

## 2015-03-08 NOTE — Anesthesia Postprocedure Evaluation (Signed)
  Anesthesia Post-op Note  Patient: Chad Smith  Procedure(s) Performed: Procedure(s) (LRB): TOTAL HIP ARTHROPLASTY ANTERIOR APPROACH (Left)  Patient Location: PACU  Anesthesia Type: Spinal  Level of Consciousness: awake and alert   Airway and Oxygen Therapy: Patient Spontanous Breathing  Post-op Pain: mild  Post-op Assessment: Post-op Vital signs reviewed, Patient's Cardiovascular Status Stable, Respiratory Function Stable, Patent Airway and No signs of Nausea or vomiting  Last Vitals:  Filed Vitals:   03/08/15 1526  BP:   Pulse:   Temp: 36.4 C  Resp:     Post-op Vital Signs: stable   Complications: No apparent anesthesia complications

## 2015-03-08 NOTE — Discharge Instructions (Signed)

## 2015-03-09 ENCOUNTER — Encounter (HOSPITAL_COMMUNITY): Payer: Self-pay | Admitting: Orthopedic Surgery

## 2015-03-09 LAB — CBC
HEMATOCRIT: 37.9 % — AB (ref 39.0–52.0)
HEMOGLOBIN: 12.6 g/dL — AB (ref 13.0–17.0)
MCH: 29.9 pg (ref 26.0–34.0)
MCHC: 33.2 g/dL (ref 30.0–36.0)
MCV: 90 fL (ref 78.0–100.0)
Platelets: 182 10*3/uL (ref 150–400)
RBC: 4.21 MIL/uL — AB (ref 4.22–5.81)
RDW: 13.1 % (ref 11.5–15.5)
WBC: 7 10*3/uL (ref 4.0–10.5)

## 2015-03-09 LAB — BASIC METABOLIC PANEL
ANION GAP: 8 (ref 5–15)
BUN: 13 mg/dL (ref 6–20)
CHLORIDE: 95 mmol/L — AB (ref 101–111)
CO2: 26 mmol/L (ref 22–32)
CREATININE: 0.85 mg/dL (ref 0.61–1.24)
Calcium: 8.3 mg/dL — ABNORMAL LOW (ref 8.9–10.3)
GFR calc non Af Amer: >60 mL/min (ref >60)
Glucose, Bld: 171 mg/dL — ABNORMAL HIGH (ref 65–99)
POTASSIUM: 3.8 mmol/L (ref 3.5–5.1)
SODIUM: 129 mmol/L — AB (ref 135–145)

## 2015-03-09 NOTE — Progress Notes (Signed)
Patient ID: Chad Smith, male   DOB: 04-Sep-1940, 74 y.o.   MRN: [id number] PATIENT ID: Chad Smith  MRN: [id number]  DOB/AGE:  10-22-1940 / 74 y.o.  1 Day Post-Op Procedure(s) (LRB): TOTAL HIP ARTHROPLASTY ANTERIOR APPROACH (Left)    PROGRESS NOTE Subjective: Patient is alert, oriented, x1 Nausea, x1 Vomiting, yes passing gas, no Bowel Movement. Taking PO well. Denies SOB, Chest or Calf Pain. Using Incentive Spirometer, PAS in place. Ambulate to bathroom Patient reports pain as 2 on 0-10 scale  .    Objective: Vital signs in last 24 hours: Filed Vitals:   03/08/15 1715 03/08/15 2059 03/09/15 0208 03/09/15 0500  BP: 125/72 120/70 96/64 126/81  Pulse: 85 83 74 85  Temp: 97.6 F (36.4 C) 97 F (36.1 C) 98 F (36.7 C) 100.3 F (37.9 C)  TempSrc: Oral Oral Oral Oral  Resp:  20 20 20   Height: 5\' 9"  (1.753 m)     Weight: 93.26 kg (205 lb 9.6 oz)     SpO2: 98% 93% 97% 94%      Intake/Output from previous day: I/O last 3 completed shifts: In: 5681 [P.O.:240; I.V.:1500] Out: 2825 [Urine:2225; Blood:600]   Intake/Output this shift:     LABORATORY DATA:  Recent Labs  03/09/15 0606  WBC 7.0  HGB 12.6*  HCT 37.9*  PLT 182  NA 129*  K 3.8  CL 95*  CO2 26  BUN 13  CREATININE 0.85  GLUCOSE 171*  CALCIUM 8.3*    Examination: Neurologically intact ABD soft Neurovascular intact Sensation intact distally Intact pulses distally Dorsiflexion/Plantar flexion intact Incision: dressing C/D/I No cellulitis present Compartment soft} XR AP&Lat of hip shows well placed\fixed THA  Assessment:   1 Day Post-Op Procedure(s) (LRB): TOTAL HIP ARTHROPLASTY ANTERIOR APPROACH (Left) ADDITIONAL DIAGNOSIS:  Expected Acute Blood Loss Anemia, Hypertension  Plan: PT/OT WBAT, THA  posterior precautions  DVT Prophylaxis: SCDx72 hrs, ASA 325 mg BID x 2 weeks  DISCHARGE PLAN: Home  DISCHARGE NEEDS: HHPT, Walker and 3-in-1 comode seat

## 2015-03-09 NOTE — Evaluation (Signed)
Physical Therapy Evaluation Patient Details Name: TIRTH COTHRON MRN: [id number] DOB: 12/10/1940 Today's Date: 03/09/2015   History of Present Illness  Pt is a 74 yo M admitted to [facility] on 10/24 for L hip arthritis requiring a L THA. Pt has a PMH of HTN and anxiety and a surgical history of back surgery and ankle fracture surgery.   Clinical Impression  Pt admitted with above diagnosis. Pt currently with functional limitations due to the deficits listed below (see PT Problem List). Pt presented with a flexed trunk during ambulation using a RW for balance support. Pt required assistance going from sit to supine with surgical leg. Performed chair exercises without an increase in pain.  Pt will benefit from skilled PT to increase their independence and safety with mobility to allow discharge to the venue listed below.      Follow Up Recommendations Home health PT    Equipment Recommendations  Rolling walker with 5" wheels    Recommendations for Other Services       Precautions / Restrictions Precautions Precautions: Fall Precaution Comments: Direct Anterior approach  Restrictions Weight Bearing Restrictions: No      Mobility  Bed Mobility Overal bed mobility: Needs Assistance Bed Mobility: Sit to Supine       Sit to supine: Min assist   General bed mobility comments: Required assistance with left leg while bringing it up onto the bed   Transfers Overall transfer level: Needs assistance   Transfers: Sit to/from Stand Sit to Stand: Min guard         General transfer comment: Forward trunk lean while using RW for balance   Ambulation/Gait Ambulation/Gait assistance: Min guard Ambulation Distance (Feet): 25 Feet Assistive device: Rolling walker (2 wheeled) Gait Pattern/deviations: Step-through pattern;Antalgic;Trunk flexed;Decreased weight shift to left     General Gait Details: Trunk flexed during gait, but with cues would stand up straight for a few feet then return  back to flexed trunk posture  Stairs            Wheelchair Mobility    Modified Rankin (Stroke Patients Only)       Balance Overall balance assessment: Needs assistance Sitting-balance support: Feet supported;Bilateral upper extremity supported Sitting balance-Leahy Scale: Poor Sitting balance - Comments: Was sitting in chair when we arrived with feet on the floor sitting up    Standing balance support: Bilateral upper extremity supported Standing balance-Leahy Scale: Poor Standing balance comment: Pt required the use of the RW to maintain balance while standing and during ambulation                             Pertinent Vitals/Pain Pain Assessment: 0-10 Pain Score: 4  Pain Location: L hip Pain Descriptors / Indicators: Aching;Operative site guarding (Surgical pain) Pain Intervention(s): Limited activity within patient's tolerance;Monitored during session;Repositioned;Ice applied    Home Living Family/patient expects to be discharged to:: Private residence Living Arrangements: Spouse/significant other Available Help at Discharge: Family;Available 24 hours/day Type of Home: House Home Access: Stairs to enter Entrance Stairs-Rails: Can reach both Entrance Stairs-Number of Steps: 1 Home Layout: One level (Has basement, but would not need to go down there normally) Home Equipment: Cane - single point Additional Comments: Pt states that he may benefit from a bedside commode to use over the normal toilet to use the arm railings for assistance    Prior Function Level of Independence: Independent  Hand Dominance   Dominant Hand: Right    Extremity/Trunk Assessment   Upper Extremity Assessment: Overall WFL for tasks assessed           Lower Extremity Assessment: Overall WFL for tasks assessed         Communication   Communication: No difficulties  Cognition Arousal/Alertness: Awake/alert Behavior During Therapy: WFL for tasks  assessed/performed Overall Cognitive Status: Within Functional Limits for tasks assessed                      General Comments General comments (skin integrity, edema, etc.): Wife and daughter in the room during eval    Exercises Total Joint Exercises Ankle Circles/Pumps: AROM;20 reps;Seated;Both Quad Sets: AROM;10 reps;Seated;Left Heel Slides: AROM;10 reps;Left;Seated      Assessment/Plan    PT Assessment Patient needs continued PT services  PT Diagnosis Generalized weakness;Acute pain;Abnormality of gait;Difficulty walking   PT Problem List Decreased strength;Decreased range of motion;Decreased balance;Decreased activity tolerance;Decreased mobility;Decreased knowledge of use of DME;Pain  PT Treatment Interventions DME instruction;Gait training;Stair training;Functional mobility training;Therapeutic activities;Therapeutic exercise;Balance training;Patient/family education   PT Goals (Current goals can be found in the Care Plan section) Acute Rehab PT Goals Patient Stated Goal: To go home PT Goal Formulation: With patient/family Time For Goal Achievement: 03/23/15 Potential to Achieve Goals: Good    Frequency 7X/week           End of Session Equipment Utilized During Treatment: Gait belt Activity Tolerance: Patient tolerated treatment well Patient left: in bed;with call bell/phone within reach;with family/visitor present           Time: 1127-1155 PT Time Calculation (min) (ACUTE ONLY): 28 min   Charges:   PT Evaluation $Initial PT Evaluation Tier I: 1 Procedure PT Treatments $Gait Training: 8-22 mins   PT G CodesGeoffry Paradise, SPT [phone] - office   03/09/2015, 1:57 PM

## 2015-03-09 NOTE — Care Management Note (Addendum)
Case Management Note  Patient Details  Name: FIELD STANISZEWSKI MRN: [id number] Date of Birth: October 14, 1940  Subjective/Objective:   74 yr old male, s/p left total hip arthroplasty.                 Action/Plan:  Case manager spoke with patient and wife concerning home health and discharge needs. Patient was preoperatively setup with [facility]. Patient's wife stated that she wanted to use a different agency but felt she didn't have a choice. Case manager explained to her that they certainly have a choice, which is why we are discussing this. Mrs. Donahoe stated she would like to use [facility]. Case manager contacted Tillie Rung at [phone], information was faxed to her at [phone].     Discharge Date:             03/10/15 Expected Discharge Plan:  [place]  In-House Referral:     Discharge planning Services  CM Consult  Post Acute Care Choice:  Durable Medical Equipment, Home Health Choice offered to:  Patient, Spouse  DME Arranged:   rolling walker, 3in1 DME Agency:   [employer]  HH Arranged:  PT [place] Agency:  [facility]  Status of Service:  In process, will continue to follow  Medicare Important Message Given:    Date Medicare IM Given:    Medicare IM give by:    Date Additional Medicare IM Given:    Additional Medicare Important Message give by:     If discussed at [place] of Stay Meetings, dates discussed:    Additional Comments:  Ninfa Meeker, RN 03/09/2015, 11:35 AM

## 2015-03-09 NOTE — Evaluation (Signed)
Occupational Therapy Evaluation Patient Details Name: Chad Smith MRN: [id number] DOB: Nov 10, 1940 Today's Date: 03/09/2015    History of Present Illness Pt is a 74 yo M admitted to [facility] on 10/24 for L hip arthritis requiring a L THA. Pt has a PMH of HTN and anxiety and a surgical history of back surgery and ankle fracture surgery.    Clinical Impression   Pt reports he was independent with ADLs PTA. Pt currently overall min guard for ADLs and mobility with min A for LB ADLs. Educated pt and family on compensatory strategies for LB ADLs, edema management techniques, home safety, use of 3 in 1 over toilet and as tub seat; pt and family verbalized understanding. Discussed AE for increased independence with LB ADLs; pt declined stating that wife could help as needed. Pt plan to d/c home with 24/7 supervision from his wife. Pt with slight nausea at end of session, RN notified. Pt would benefit from continued skilled OT services in order to maximize independence and safety with tub transfers using 3 in 1.     Follow Up Recommendations  No OT follow up;Supervision - Intermittent    Equipment Recommendations  3 in 1 bedside comode    Recommendations for Other Services       Precautions / Restrictions Precautions Precautions: Fall Precaution Comments: Direct Anterior approach  Restrictions Weight Bearing Restrictions: No      Mobility Bed Mobility Overal bed mobility: Needs Assistance Bed Mobility: Supine to Sit;Sit to Supine     Supine to sit: Min assist Sit to supine: Min assist   General bed mobility comments: Min assist for managing LLE  Transfers Overall transfer level: Needs assistance Equipment used: Rolling walker (2 wheeled) Transfers: Sit to/from Stand Sit to Stand: Min guard         General transfer comment: Min guard for safety. Good hand placement. Sit <> stand from EOB x 1    Balance Overall balance assessment: Needs assistance Sitting-balance support:  Feet supported;No upper extremity supported Sitting balance-Leahy Scale: Fair     Standing balance support: Bilateral upper extremity supported Standing balance-Leahy Scale: Poor Standing balance comment: RW for support                            ADL Overall ADL's : Needs assistance/impaired Eating/Feeding: Set up;Sitting           Lower Body Bathing: Minimal assistance;Sit to/from stand       Lower Body Dressing: Minimal assistance;Sit to/from stand   Toilet Transfer: Min guard;Ambulation;BSC;RW (BSC over toilet)           Functional mobility during ADLs: Min guard;Rolling walker General ADL Comments: Wife and daughter present for OT eval. Educated pt and family on compensatory strategies for LB ADLs, edema management techniques, home safety, use of 3 in 1 over toilet and in tub; pt verbalized understanding. Discussed use of AE for increased independence with LB ADLs; pt and wife agree that wife can assist as needed.       Vision     Perception     Praxis      Pertinent Vitals/Pain Pain Assessment: Faces Pain Score: 6  Faces Pain Scale: Hurts even more Pain Location: L hip Pain Descriptors / Indicators: Aching;Grimacing Pain Intervention(s): Limited activity within patient's tolerance;Monitored during session;Repositioned;Ice applied     Hand Dominance Right   Extremity/Trunk Assessment Upper Extremity Assessment Upper Extremity Assessment: Overall WFL for tasks assessed  Lower Extremity Assessment Lower Extremity Assessment: Defer to PT evaluation   Cervical / Trunk Assessment Cervical / Trunk Assessment: Kyphotic   Communication Communication Communication: No difficulties   Cognition Arousal/Alertness: Awake/alert Behavior During Therapy: WFL for tasks assessed/performed Overall Cognitive Status: Within Functional Limits for tasks assessed                     General Comments       Exercises Exercises: Total Joint      Shoulder Instructions      Home Living Family/patient expects to be discharged to:: Private residence Living Arrangements: Spouse/significant other Available Help at Discharge: Family;Available 24 hours/day Type of Home: House Home Access: Stairs to enter [employer] of Steps: 1 Entrance Stairs-Rails: Can reach both Home Layout: One level     Bathroom Shower/Tub: Teacher, early years/pre: Standard Bathroom Accessibility: Yes How Accessible: Accessible via walker Home Equipment: Cane - single point          Prior Functioning/Environment Level of Independence: Independent             OT Diagnosis: Generalized weakness;Acute pain   OT Problem List: Decreased activity tolerance;Impaired balance (sitting and/or standing);Decreased safety awareness;Decreased knowledge of use of DME or AE;Decreased knowledge of precautions;Pain   OT Treatment/Interventions: Self-care/ADL training;DME and/or AE instruction;Patient/family education    OT Goals(Current goals can be found in the care plan section) Acute Rehab OT Goals Patient Stated Goal: to go home tomorrow OT Goal Formulation: With patient/family Time For Goal Achievement: 03/23/15 Potential to Achieve Goals: Good ADL Goals Pt Will Perform Tub/Shower Transfer: Tub transfer;with supervision;ambulating;3 in 1;rolling walker  OT Frequency: Min 2X/week   Barriers to D/C:            Co-evaluation              End of Session Equipment Utilized During Treatment: Rolling walker Nurse Communication: Other (comment) (pt with nausea)  Activity Tolerance: Patient tolerated treatment well Patient left: in bed;with call bell/phone within reach;with family/visitor present   Time: 6168-3729 OT Time Calculation (min): 20 min Charges:  OT General Charges $OT Visit: 1 Procedure OT Evaluation $Initial OT Evaluation Tier I: 1 Procedure G-Codes:     Binnie Kand M.S., OTR/L Pager: [phone]   03/09/2015, 3:53 PM

## 2015-03-09 NOTE — Progress Notes (Signed)
Physical Therapy Treatment Patient Details Name: Chad Smith MRN: [id number] DOB: 1940-11-01 Today's Date: 03/09/2015    History of Present Illness Pt is a 74 yo M admitted to [facility] on 10/24 for L hip arthritis requiring a L THA. Pt has a PMH of HTN and anxiety and a surgical history of back surgery and ankle fracture surgery.     PT Comments    The pt. was able to complete all sitting and standing surgical hip exercises without increase in pain.  He was also able to ambulate a further distance and was more aware of keeping proper posture.  He is still slightly nauseous but this did not affect treatment.       Follow Up Recommendations  Home health PT     Equipment Recommendations  Rolling walker with 5" wheels       Precautions / Restrictions Precautions Precautions: Fall Precaution Comments: Direct Anterior approach; confirmed with PA via phone Restrictions Weight Bearing Restrictions: No    Mobility  Bed Mobility Overal bed mobility: Needs Assistance Bed Mobility: Supine to Sit;Sit to Supine     Supine to sit: Min assist Sit to supine: Min assist   General bed mobility comments: Min assist for managing LLE  Transfers Overall transfer level: Needs assistance Equipment used: Rolling walker (2 wheeled) Transfers: Sit to/from Stand Sit to Stand: Min guard         General transfer comment: Min guard for safety. Good hand placement. Sit <> stand from EOB x 1  Ambulation/Gait Ambulation/Gait assistance: Min guard Ambulation Distance (Feet): 50 Feet Assistive device: Rolling walker (2 wheeled) Gait Pattern/deviations: Step-through pattern;Antalgic;Trunk flexed;Decreased weight shift to left     General Gait Details: Pt. was more aware of trunk flexion and worked to remain upright with proper posture during gait; cues needed to move walker ahead before stepping       Balance Overall balance assessment: Needs assistance Sitting-balance support: Feet  supported;No upper extremity supported Sitting balance-Leahy Scale: Fair     Standing balance support: Bilateral upper extremity supported Standing balance-Leahy Scale: Poor Standing balance comment: RW for support                    Cognition Arousal/Alertness: Awake/alert Behavior During Therapy: WFL for tasks assessed/performed Overall Cognitive Status: Within Functional Limits for tasks assessed                      Exercises Total Joint Exercises Hip ABduction/ADduction: AROM;Left;10 reps;Standing (hip abduction) Long Arc Quad: AROM;Left;10 reps (hold for 5 seconds) Knee Flexion: AROM;Left;Standing;10 reps Marching in Standing: AROM;Left;10 reps Standing Hip Extension: AROM;Left;10 reps    General Comments General comments (skin integrity, edema, etc.): wife and daughter present during treament      Pertinent Vitals/Pain Pain Assessment: Faces Pain Score: 6  Faces Pain Scale: Hurts even more Pain Location: L hip Pain Descriptors / Indicators: Aching;Grimacing Pain Intervention(s): Limited activity within patient's tolerance;Monitored during session;Repositioned;Ice applied    Home Living Family/patient expects to be discharged to:: Private residence Living Arrangements: Spouse/significant other Available Help at Discharge: Family;Available 24 hours/day Type of Home: House Home Access: Stairs to enter Entrance Stairs-Rails: Can reach both Home Layout: One level Home Equipment: Cane - single point      Prior Function Level of Independence: Independent          PT Goals (current goals can now be found in the care plan section) Acute Rehab PT Goals Patient Stated Goal: to  go home tomorrow Progress towards PT goals: Progressing toward goals    Frequency  7X/week    PT Plan Current plan remains appropriate       End of Session Equipment Utilized During Treatment: Gait belt Activity Tolerance: Patient tolerated treatment well Patient  left: in bed;with call bell/phone within reach;with family/visitor present;Other (comment) (w/ ice pack)     Time: 6333-5456 PT Time Calculation (min) (ACUTE ONLY): 21 min  Charges:  [place], [place] - Office   03/09/2015, 4:07 PM

## 2015-03-10 LAB — CBC
HEMATOCRIT: 37.6 % — AB (ref 39.0–52.0)
HEMOGLOBIN: 12.8 g/dL — AB (ref 13.0–17.0)
MCH: 30.8 pg (ref 26.0–34.0)
MCHC: 34 g/dL (ref 30.0–36.0)
MCV: 90.4 fL (ref 78.0–100.0)
PLATELETS: 196 10*3/uL (ref 150–400)
RBC: 4.16 MIL/uL — AB (ref 4.22–5.81)
RDW: 13.1 % (ref 11.5–15.5)
WBC: 8.3 10*3/uL (ref 4.0–10.5)

## 2015-03-10 NOTE — Progress Notes (Signed)
Reviewed d/c instructions including AVS, Rx's, care for incision and answered all questions. Prescriptions given. Nursing tech will assist with d/c including taking patient to exit via w/c with family / taking personal possessions and equipment.

## 2015-03-10 NOTE — Progress Notes (Signed)
Physical Therapy Treatment Patient Details Name: Chad Smith MRN: [id number] DOB: Jul 20, 1940 Today's Date: 03/10/2015    History of Present Illness Pt is a 74 yo M admitted to [facility] on 10/24 for L hip arthritis requiring a L THA. Pt has a PMH of HTN and anxiety and a surgical history of back surgery and ankle fracture surgery.     PT Comments    Although still slightly nauseous, the pt. was able to perform all lying exercises and ambulated 150 ft. with supervision for safety with no subsequent increase in pain.  He is becoming more independent with transfers and bed mobility.  He should be able to begin stair training this afternoon to prepare for D/C.    Follow Up Recommendations  Home health PT     Equipment Recommendations  Rolling walker with 5" wheels       Precautions / Restrictions Precautions Precautions: Fall Precaution Comments: Direct Anterior approach; confirmed with PA via phone Restrictions Weight Bearing Restrictions: Yes LLE Weight Bearing: Weight bearing as tolerated    Mobility  Bed Mobility Overal bed mobility: Modified Independent Bed Mobility: Supine to Sit     Supine to sit: Modified independent (Device/Increase time)     General bed mobility comments: Pt. required the use of the bed rail to roll/sit up and used his arms to lift his left leg in the transition to supine   Transfers Overall transfer level: Needs assistance Equipment used: Rolling walker (2 wheeled) Transfers: Sit to/from Stand Sit to Stand: Supervision         General transfer comment: Supervision for safety and balance; pt. able to stand with use of arms; no LOB when standing  Ambulation/Gait Ambulation/Gait assistance: Supervision Ambulation Distance (Feet): 150 Feet Assistive device: Rolling walker (2 wheeled) Gait Pattern/deviations: Step-through pattern;Decreased weight shift to left;Antalgic;Trunk flexed;Decreased stance time - left     General Gait Details: Pt.  continues to work in proper posture while ambulating; still had slight trouble keeping the walker out in front of him - Verbal cues needed to maintain walker movement; supervision for safety; Pt. used personal walker for home use and therapist adjusted to pt.'s height      Balance Overall balance assessment: Needs assistance Sitting-balance support: Feet supported;Bilateral upper extremity supported Sitting balance-Leahy Scale: Fair     Standing balance support: Bilateral upper extremity supported;During functional activity Standing balance-Leahy Scale: Fair Standing balance comment: RW for support during ambulation; pt. was able to maintain static balance beside bed with no handhold support from therapist                    Cognition Arousal/Alertness: Awake/alert Behavior During Therapy: WFL for tasks assessed/performed Overall Cognitive Status: Within Functional Limits for tasks assessed                      Exercises Total Joint Exercises Ankle Circles/Pumps: AROM;Both;20 reps;Supine Quad Sets: AROM;10 reps;Supine;Both Short Arc Quad: AROM;Left;10 reps;Supine Heel Slides: AROM;Left;10 reps;Supine Hip ABduction/ADduction: Left;10 reps;Supine;AAROM    General Comments General comments (skin integrity, edema, etc.): wife and daughter present during treatment      Pertinent Vitals/Pain Pain Assessment: 0-10 Pain Score: 5  Faces Pain Scale: Hurts even more Pain Location: L hip Pain Descriptors / Indicators: Aching;Grimacing Pain Intervention(s): Limited activity within patient's tolerance;Monitored during session;Repositioned           PT Goals (current goals can now be found in the care plan section) Acute Rehab PT Goals  Patient Stated Goal: to go home Progress towards PT goals: Progressing toward goals    Frequency  7X/week    PT Plan Current plan remains appropriate       End of Session Equipment Utilized During Treatment: Gait belt Activity  Tolerance: Patient tolerated treatment well Patient left: in chair;with call bell/phone within reach;with family/visitor present     Time: 1130-1155 PT Time Calculation (min) (ACUTE ONLY): 25 min  Charges:  1 gait, 1 TE                    G Codes:      Gara Kincade 2015/03/13, 1:59 PM

## 2015-03-10 NOTE — Progress Notes (Signed)
Occupational Therapy Treatment Patient Details Name: Chad Smith MRN: [id number] DOB: April 28, 1941 Today's Date: 03/10/2015    History of present illness Pt is a 74 yo M admitted to [facility] on 10/24 for L hip arthritis requiring a L THA. Pt has a PMH of HTN and anxiety and a surgical history of back surgery and ankle fracture surgery.    OT comments  Pt limited this session by nausea, declined OOB activities. Pt and family education was focus of this session. Educated pt and family on tub transfer technique with 3 in 1. Brought wife to therapy gym to educate and demonstrate how 3 in 1 fits in tub and transfer technique; wife verbalized understanding with teach back. Provided wife with handout for 3 in 1 in the tub. Pt ready to d/c from an OT standpoint when medically ready. Will continue to follow acutely.    Follow Up Recommendations  No OT follow up;Supervision - Intermittent    Equipment Recommendations  3 in 1 bedside comode    Recommendations for Other Services      Precautions / Restrictions Precautions Precautions: Fall Precaution Comments: Direct Anterior approach; confirmed with PA via phone Restrictions Weight Bearing Restrictions: Yes LLE Weight Bearing: Weight bearing as tolerated       Mobility Bed Mobility               General bed mobility comments: Pt in bed, declined OOB activities due to nausea  Transfers                      Balance                                   ADL Overall ADL's : Needs assistance/impaired                                       General ADL Comments: Wife and daughter present during OT session. Pt too nauseated to participate in OOB therapy at this time. Educated pt and family on tub transfer technique. Brought wife down to therapy gym to educate and demonstrate on how 3 in 1 fits in tub and transfer technique; wife verbalized and taught back technique. Provided wife with handout for 3 in 1  as tub seat.       Vision                     Perception     Praxis      Cognition   Behavior During Therapy: WFL for tasks assessed/performed Overall Cognitive Status: Within Functional Limits for tasks assessed                       Extremity/Trunk Assessment               Exercises     Shoulder Instructions       General Comments      Pertinent Vitals/ Pain       Pain Assessment: Faces Faces Pain Scale: Hurts even more Pain Location: pt with nausea  Home Living  Prior Functioning/Environment              Frequency Min 2X/week     Progress Toward Goals  OT Goals(current goals can now be found in the care plan section)  Progress towards OT goals: Progressing toward goals  Acute Rehab OT Goals Patient Stated Goal: to go home today  OT Goal Formulation: With patient/family  Plan Discharge plan remains appropriate    Co-evaluation                 End of Session     Activity Tolerance Other (comment) (Pt limited by nausea)   Patient Left in bed;with call bell/phone within reach;with family/visitor present   Nurse Communication          Time: 1660-6004 OT Time Calculation (min): 14 min  Charges: OT General Charges $OT Visit: 1 Procedure OT Treatments $Self Care/Home Management : 8-22 mins  Binnie Kand M.S., OTR/L Pager: [phone]  03/10/2015, 11:28 AM

## 2015-03-10 NOTE — Progress Notes (Signed)
OT Cancellation Note  Patient Details Name: Chad Smith MRN: [id number] DOB: 07-15-1940   Cancelled Treatment:    Reason Eval/Treat Not Completed: Medical issues which prohibited therapy (pt with nausea ). Pt reports significant nausea this am; RN notified. Will check back for OT as time allows and pt is appropriate.   Binnie Kand M.S., OTR/L Pager: [phone]  03/10/2015, 8:47 AM

## 2015-03-10 NOTE — Discharge Summary (Signed)
Patient ID: Chad Smith MRN: [id number] DOB/AGE: 14-Oct-1940 74 y.o.  Admit date: 03/08/2015 Discharge date: 03/10/2015  Admission Diagnoses:  Active Problems:   Arthritis, hip   Discharge Diagnoses:  Same  Past Medical History  Diagnosis Date  . Hypertension   . Arthritis   . Sleep apnea     v pap  . Anxiety     Surgeries: Procedure(s): TOTAL HIP ARTHROPLASTY ANTERIOR APPROACH on 03/08/2015   Consultants:    Discharged Condition: Improved  Hospital Course: Chad Smith is an 74 y.o. male who was admitted 03/08/2015 for operative treatment of<principal problem not specified>. Patient has severe unremitting pain that affects sleep, daily activities, and work/hobbies. After pre-op clearance the patient was taken to the operating room on 03/08/2015 and underwent  Procedure(s): TOTAL HIP ARTHROPLASTY ANTERIOR APPROACH.    Patient was given perioperative antibiotics: Anti-infectives    Start     Dose/Rate Route Frequency Ordered Stop   03/08/15 0600  ceFAZolin (ANCEF) IVPB 2 g/50 mL premix     2 g 100 mL/hr over 30 Minutes Intravenous On call to O.R. 03/07/15 1518 03/08/15 1125       Patient was given sequential compression devices, early ambulation, and chemoprophylaxis to prevent DVT.  Patient benefited maximally from hospital stay and there were no complications.    Recent vital signs: Patient Vitals for the past 24 hrs:  BP Temp Temp src Pulse Resp SpO2  03/10/15 1100 133/74 mmHg - - 84 - 93 %  03/10/15 0539 124/76 mmHg 98.1 F (36.7 C) Oral 81 19 95 %  03/09/15 2325 124/76 mmHg 98.9 F (37.2 C) Oral 95 20 96 %  03/09/15 1427 (!) 136/93 mmHg - - (!) 102 - 97 %  03/09/15 1300 131/77 mmHg 98 F (36.7 C) Oral 84 20 98 %     Recent laboratory studies:  Recent Labs  03/09/15 0606 03/10/15 0320  WBC 7.0 8.3  HGB 12.6* 12.8*  HCT 37.9* 37.6*  PLT 182 196  NA 129*  --   K 3.8  --   CL 95*  --   CO2 26  --   BUN 13  --   CREATININE 0.85  --   GLUCOSE  171*  --   CALCIUM 8.3*  --      Discharge Medications:     Medication List    STOP taking these medications        ADVIL PM 200-25 MG Caps  Generic drug:  Ibuprofen-Diphenhydramine HCl      TAKE these medications        aspirin 81 MG tablet  Take 81 mg by mouth daily.     aspirin EC 325 MG tablet  Take 1 tablet (325 mg total) by mouth 2 (two) times daily.     b complex vitamins tablet  Take 1 tablet by mouth daily.     CALCIUM 1200 PO  Take 1 tablet by mouth daily.     cholecalciferol 1000 UNITS tablet  Commonly known as:  VITAMIN D  Take 1,000 Units by mouth daily.     ezetimibe 10 MG tablet  Commonly known as:  ZETIA  Take 10 mg by mouth daily.     gabapentin 600 MG tablet  Commonly known as:  NEURONTIN  Take 900 mg by mouth 4 (four) times daily as needed.     GLUCOSAMINE 1500 COMPLEX PO  Take 1 tablet by mouth daily.     losartan 100 MG tablet  Commonly  known as:  COZAAR  Take 100 mg by mouth daily.     multivitamin with minerals tablet  Take 1 tablet by mouth daily.     oxyCODONE-acetaminophen 5-325 MG tablet  Commonly known as:  ROXICET  Take 1 tablet by mouth every 4 (four) hours as needed.     oxymetazoline 0.05 % nasal spray  Commonly known as:  AFRIN  Place 1 spray into both nostrils 2 (two) times daily as needed for congestion.     PROBIOTIC ACIDOPHILUS PO  Take 1 capsule by mouth daily.     simvastatin 40 MG tablet  Commonly known as:  ZOCOR  Take 40 mg by mouth daily.     tiZANidine 2 MG tablet  Commonly known as:  ZANAFLEX  Take 1 tablet (2 mg total) by mouth every 6 (six) hours as needed for muscle spasms.     travoprost (benzalkonium) 0.004 % ophthalmic solution  Commonly known as:  TRAVATAN  1 drop at bedtime.     vitamin A 10000 UNIT capsule  Take 10,000 Units by mouth daily.     vitamin C 500 MG tablet  Commonly known as:  ASCORBIC ACID  Take 500 mg by mouth daily.     vitamin E 400 UNIT capsule  Take 400 Units by  mouth daily.     ZINC 15 PO  Take 1 tablet by mouth daily.        Diagnostic Studies: Dg Chest 2 View  02/24/2015  CLINICAL DATA:  Preoperative examination, history of hypertension and sleep apnea. EXAM: CHEST  2 VIEW COMPARISON:  Report of a PA and lateral chest x-ray of February 11, 2013 FINDINGS: The lungs are adequately inflated. The interstitial markings are coarse especially in the mid and lower left lung. The heart and pulmonary vascularity are normal. The mediastinum is normal in width. There is no pleural effusion. There is moderate multilevel degenerative disc disease of the thoracic spine. IMPRESSION: Probable chronic bronchitic change.  There is no pneumonia nor CHF. Electronically Signed   By: David  Martinique M.D.   On: 02/24/2015 11:57   Dg Pelvis Portable  03/08/2015  CLINICAL DATA:  Postop left hip arthroplasty EXAM: PORTABLE PELVIS 1-2 VIEWS COMPARISON:  None. FINDINGS: Acetabular and femoral components of left hip arthroplasty in anticipated position. Anticipated postoperative soft tissue change over the left hip. Mild arthritic change of the right hip joint. IMPRESSION: Anticipated postoperative appearance Electronically Signed   By: Skipper Cliche M.D.   On: 03/08/2015 14:38   Dg Hip Operative Unilat With Pelvis Left  03/08/2015  CLINICAL DATA:  74 year old male status post left hip arthroplasty via anterior approach EXAM: OPERATIVE LEFT HIP (WITH PELVIS IF PERFORMED) 2 VIEWS TECHNIQUE: Fluoroscopic spot image(s) were submitted for interpretation post-operatively. COMPARISON:  None. FINDINGS: Interval surgical changes of left hip arthroplasty. No evidence of acute hardware complication. Surgical clips project over the anatomic pelvis. Soft tissue defect consistent with surgical incision. IMPRESSION: Left hip arthroplasty without evidence of complicating feature. Electronically Signed   By: Jacqulynn Cadet M.D.   On: 03/08/2015 13:20    Disposition:       Discharge  Instructions    Call MD / Call 911    Complete by:  As directed   If you experience chest pain or shortness of breath, CALL 911 and be transported to the hospital emergency room.  If you develope a fever above 101 F, pus (white drainage) or increased drainage or redness at the wound, or calf  pain, call your surgeon's office.     Change dressing    Complete by:  As directed   You may change your dressing on day 5, then change the dressing daily with sterile 4 x 4 inch gauze dressing and paper tape.  You may clean the incision with alcohol prior to redressing     Constipation Prevention    Complete by:  As directed   Drink plenty of fluids.  Prune juice may be helpful.  You may use a stool softener, such as Colace (over the counter) 100 mg twice a day.  Use MiraLax (over the counter) for constipation as needed.     Diet - low sodium heart healthy    Complete by:  As directed      Driving restrictions    Complete by:  As directed   No driving for 2 weeks     Follow the hip precautions as taught in Physical Therapy    Complete by:  As directed      Increase activity slowly as tolerated    Complete by:  As directed      Patient may shower    Complete by:  As directed   You may shower without a dressing once there is no drainage.  Do not wash over the wound.  If drainage remains, cover wound with plastic wrap and then shower.           Follow-up Information    Follow up with Kerin Salen, MD In 2 weeks.   Specialty:  Orthopedic Surgery   Contact information:   [place] [address] [phone]        Signed: Theodosia Quay 03/10/2015, 12:26 PM

## 2015-03-10 NOTE — Plan of Care (Signed)
Problem: Consults Goal: Diagnosis- Total Joint Replacement Outcome: Completed/Met Date Met:  03/10/15 Primary Total Hip Left

## 2015-03-10 NOTE — Progress Notes (Signed)
Called [employer] - talked with Manuela Schwartz; informed patient has had continuous problems with nausea and needs Rx for Nausea - had been taking Zofran 5 mg PO Q 6 hours. Informed [facility] [phone] in [place]. Stated would be called into his pharmacy.

## 2015-03-10 NOTE — Progress Notes (Signed)
PATIENT ID: SUSANO CLECKLER  MRN: [id number]  DOB/AGE:  1940/08/23 / 74 y.o.  2 Days Post-Op Procedure(s) (LRB): TOTAL HIP ARTHROPLASTY ANTERIOR APPROACH (Left)    PROGRESS NOTE Subjective: Patient is alert, oriented, mild Nausea, no Vomiting, yes passing gas, no Bowel Movement. Taking PO with small bites. Denies SOB, Chest or Calf Pain. Using Incentive Spirometer, PAS in place. Ambulate WBAT with pt walking 50 ft with therapy Patient reports pain as mild and moderate  .    Objective: Vital signs in last 24 hours: Filed Vitals:   03/09/15 1300 03/09/15 1427 03/09/15 2325 03/10/15 0539  BP: 131/77 136/93 124/76 124/76  Pulse: 84 102 95 81  Temp: 98 F (36.7 C)  98.9 F (37.2 C) 98.1 F (36.7 C)  TempSrc: Oral  Oral Oral  Resp: 20  20 19   Height:      Weight:      SpO2: 98% 97% 96% 95%      Intake/Output from previous day: I/O last 3 completed shifts: In: 960 [P.O.:960] Out: 2650 [Urine:2650]   Intake/Output this shift:     LABORATORY DATA:  Recent Labs  03/09/15 0606 03/10/15 0320  WBC 7.0 8.3  HGB 12.6* 12.8*  HCT 37.9* 37.6*  PLT 182 196  NA 129*  --   K 3.8  --   CL 95*  --   CO2 26  --   BUN 13  --   CREATININE 0.85  --   GLUCOSE 171*  --   CALCIUM 8.3*  --     Examination: Neurologically intact Neurovascular intact Sensation intact distally Intact pulses distally Dorsiflexion/Plantar flexion intact Incision: dressing C/D/I No cellulitis present Compartment soft} XR AP&Lat of hip shows well placed\fixed THA  Assessment:   2 Days Post-Op Procedure(s) (LRB): TOTAL HIP ARTHROPLASTY ANTERIOR APPROACH (Left) ADDITIONAL DIAGNOSIS:  Expected Acute Blood Loss Anemia, Hypertension  Plan: PT/OT WBAT,   DVT Prophylaxis: SCDx72 hrs, ASA 325 mg BID x 2 weeks  DISCHARGE PLAN: Home, when pt passes therapy goals.  DISCHARGE NEEDS: HHPT, Walker and 3-in-1 comode seat

## 2015-03-10 NOTE — Progress Notes (Signed)
Physical Therapy Treatment Patient Details Name: Chad Smith MRN: [id number] DOB: 1940-08-02 Today's Date: 03/10/2015    History of Present Illness Pt is a 74 yo M admitted to [facility] on 10/24 for L hip arthritis requiring a L THA. Pt has a PMH of HTN and anxiety and a surgical history of back surgery and ankle fracture surgery.     PT Comments    The pt. was able to complete stair training during treatment today and wife was educated on the proper way to assist him when ascending/descending stairs.  He no longer reported pain and was very attentive to his posture during ambulation.  Plan D/C this afternoon.     Follow Up Recommendations  Home health PT     Equipment Recommendations  Rolling walker with 5" wheels       Precautions / Restrictions Precautions Precautions: Fall Precaution Comments: Direct Anterior approach; confirmed with PA via phone Restrictions Weight Bearing Restrictions: Yes LLE Weight Bearing: Weight bearing as tolerated    Mobility  Bed Mobility Overal bed mobility: Modified Independent Bed Mobility: Supine to Sit     Supine to sit: Modified independent (Device/Increase time)     General bed mobility comments: Pt. required the use of the bed rail to roll/sit up and was able to swing his leg off the bed a little easier than last treatment  Transfers Overall transfer level: Needs assistance Equipment used: Rolling walker (2 wheeled) Transfers: Sit to/from Stand Sit to Stand: Modified independent (Device/Increase time)         General transfer comment: Mod. I as patient used arms to push to stand and RW for support once standing   Ambulation/Gait Ambulation/Gait assistance: Supervision Ambulation Distance (Feet): 200 Feet Assistive device: Rolling walker (2 wheeled) Gait Pattern/deviations: Step-through pattern;Trunk flexed;Antalgic     General Gait Details: Posture during ambulation continues to improve; Pt. was able to increase speed and  use walker more safely   Stairs Stairs: Yes Stairs assistance: Min assist Stair Management: One rail Right;Step to pattern;Backwards;Forwards Number of Stairs: 6 (x3) General stair comments: Wife was taught how to correctly aid him in ascending/descending stairs; son watched as therapist taught wife     Balance Overall balance assessment: Needs assistance Sitting-balance support: Feet supported;Bilateral upper extremity supported Sitting balance-Leahy Scale: Good     Standing balance support: Bilateral upper extremity supported;During functional activity Standing balance-Leahy Scale: Fair Standing balance comment: RW for support during ambulation; Pt. was able to stand at top of stairs when stair training one hand on rail                    Cognition Arousal/Alertness: Awake/alert Behavior During Therapy: WFL for tasks assessed/performed Overall Cognitive Status: Within Functional Limits for tasks assessed                         General Comments General comments (skin integrity, edema, etc.): wife, daughter, and son present during treatment      Pertinent Vitals/Pain Pain Assessment: No/denies pain           PT Goals (current goals can now be found in the care plan section) Acute Rehab PT Goals Patient Stated Goal: to go home Progress towards PT goals: Progressing toward goals    Frequency  7X/week    PT Plan Current plan remains appropriate       End of Session Equipment Utilized During Treatment: Gait belt Activity Tolerance: Patient tolerated treatment well  Patient left: in chair;with call bell/phone within reach;with family/visitor present     Time: 9604-5409 PT Time Calculation (min) (ACUTE ONLY): 24 min  Charges: 2 Gait                   [place], [place] - Office   03/10/2015, 5:41 PM

## 2015-03-12 DIAGNOSIS — F419 Anxiety disorder, unspecified: Secondary | ICD-10-CM | POA: Diagnosis not present

## 2015-03-12 DIAGNOSIS — I10 Essential (primary) hypertension: Secondary | ICD-10-CM | POA: Diagnosis not present

## 2015-03-12 DIAGNOSIS — M1990 Unspecified osteoarthritis, unspecified site: Secondary | ICD-10-CM | POA: Diagnosis not present

## 2015-03-12 DIAGNOSIS — Z7982 Long term (current) use of aspirin: Secondary | ICD-10-CM | POA: Diagnosis not present

## 2015-03-12 DIAGNOSIS — Z471 Aftercare following joint replacement surgery: Secondary | ICD-10-CM | POA: Diagnosis not present

## 2015-03-12 DIAGNOSIS — Z96642 Presence of left artificial hip joint: Secondary | ICD-10-CM | POA: Diagnosis not present

## 2015-03-12 DIAGNOSIS — Z8546 Personal history of malignant neoplasm of prostate: Secondary | ICD-10-CM | POA: Diagnosis not present

## 2015-03-15 DIAGNOSIS — Z471 Aftercare following joint replacement surgery: Secondary | ICD-10-CM | POA: Diagnosis not present

## 2015-03-15 DIAGNOSIS — I10 Essential (primary) hypertension: Secondary | ICD-10-CM | POA: Diagnosis not present

## 2015-03-15 DIAGNOSIS — M1990 Unspecified osteoarthritis, unspecified site: Secondary | ICD-10-CM | POA: Diagnosis not present

## 2015-03-15 DIAGNOSIS — F419 Anxiety disorder, unspecified: Secondary | ICD-10-CM | POA: Diagnosis not present

## 2015-03-15 DIAGNOSIS — Z96642 Presence of left artificial hip joint: Secondary | ICD-10-CM | POA: Diagnosis not present

## 2015-03-15 DIAGNOSIS — Z7982 Long term (current) use of aspirin: Secondary | ICD-10-CM | POA: Diagnosis not present

## 2015-03-15 DIAGNOSIS — Z8546 Personal history of malignant neoplasm of prostate: Secondary | ICD-10-CM | POA: Diagnosis not present

## 2015-03-16 DIAGNOSIS — M1990 Unspecified osteoarthritis, unspecified site: Secondary | ICD-10-CM | POA: Diagnosis not present

## 2015-03-16 DIAGNOSIS — Z7982 Long term (current) use of aspirin: Secondary | ICD-10-CM | POA: Diagnosis not present

## 2015-03-16 DIAGNOSIS — Z471 Aftercare following joint replacement surgery: Secondary | ICD-10-CM | POA: Diagnosis not present

## 2015-03-16 DIAGNOSIS — Z8546 Personal history of malignant neoplasm of prostate: Secondary | ICD-10-CM | POA: Diagnosis not present

## 2015-03-16 DIAGNOSIS — F419 Anxiety disorder, unspecified: Secondary | ICD-10-CM | POA: Diagnosis not present

## 2015-03-16 DIAGNOSIS — Z96642 Presence of left artificial hip joint: Secondary | ICD-10-CM | POA: Diagnosis not present

## 2015-03-16 DIAGNOSIS — I10 Essential (primary) hypertension: Secondary | ICD-10-CM | POA: Diagnosis not present

## 2015-03-17 DIAGNOSIS — Z471 Aftercare following joint replacement surgery: Secondary | ICD-10-CM | POA: Diagnosis not present

## 2015-03-17 DIAGNOSIS — Z8546 Personal history of malignant neoplasm of prostate: Secondary | ICD-10-CM | POA: Diagnosis not present

## 2015-03-17 DIAGNOSIS — I10 Essential (primary) hypertension: Secondary | ICD-10-CM | POA: Diagnosis not present

## 2015-03-17 DIAGNOSIS — Z96642 Presence of left artificial hip joint: Secondary | ICD-10-CM | POA: Diagnosis not present

## 2015-03-17 DIAGNOSIS — M1990 Unspecified osteoarthritis, unspecified site: Secondary | ICD-10-CM | POA: Diagnosis not present

## 2015-03-17 DIAGNOSIS — Z7982 Long term (current) use of aspirin: Secondary | ICD-10-CM | POA: Diagnosis not present

## 2015-03-17 DIAGNOSIS — F419 Anxiety disorder, unspecified: Secondary | ICD-10-CM | POA: Diagnosis not present

## 2015-03-19 DIAGNOSIS — Z96642 Presence of left artificial hip joint: Secondary | ICD-10-CM | POA: Diagnosis not present

## 2015-03-19 DIAGNOSIS — I10 Essential (primary) hypertension: Secondary | ICD-10-CM | POA: Diagnosis not present

## 2015-03-19 DIAGNOSIS — Z8546 Personal history of malignant neoplasm of prostate: Secondary | ICD-10-CM | POA: Diagnosis not present

## 2015-03-19 DIAGNOSIS — F419 Anxiety disorder, unspecified: Secondary | ICD-10-CM | POA: Diagnosis not present

## 2015-03-19 DIAGNOSIS — Z471 Aftercare following joint replacement surgery: Secondary | ICD-10-CM | POA: Diagnosis not present

## 2015-03-19 DIAGNOSIS — Z7982 Long term (current) use of aspirin: Secondary | ICD-10-CM | POA: Diagnosis not present

## 2015-03-19 DIAGNOSIS — M1990 Unspecified osteoarthritis, unspecified site: Secondary | ICD-10-CM | POA: Diagnosis not present

## 2015-03-22 DIAGNOSIS — I10 Essential (primary) hypertension: Secondary | ICD-10-CM | POA: Diagnosis not present

## 2015-03-22 DIAGNOSIS — Z7982 Long term (current) use of aspirin: Secondary | ICD-10-CM | POA: Diagnosis not present

## 2015-03-22 DIAGNOSIS — Z8546 Personal history of malignant neoplasm of prostate: Secondary | ICD-10-CM | POA: Diagnosis not present

## 2015-03-22 DIAGNOSIS — Z96642 Presence of left artificial hip joint: Secondary | ICD-10-CM | POA: Diagnosis not present

## 2015-03-22 DIAGNOSIS — F419 Anxiety disorder, unspecified: Secondary | ICD-10-CM | POA: Diagnosis not present

## 2015-03-22 DIAGNOSIS — M1990 Unspecified osteoarthritis, unspecified site: Secondary | ICD-10-CM | POA: Diagnosis not present

## 2015-03-22 DIAGNOSIS — Z471 Aftercare following joint replacement surgery: Secondary | ICD-10-CM | POA: Diagnosis not present

## 2015-03-23 DIAGNOSIS — M25552 Pain in left hip: Secondary | ICD-10-CM | POA: Diagnosis not present

## 2015-03-24 DIAGNOSIS — M1990 Unspecified osteoarthritis, unspecified site: Secondary | ICD-10-CM | POA: Diagnosis not present

## 2015-03-24 DIAGNOSIS — F419 Anxiety disorder, unspecified: Secondary | ICD-10-CM | POA: Diagnosis not present

## 2015-03-24 DIAGNOSIS — Z7982 Long term (current) use of aspirin: Secondary | ICD-10-CM | POA: Diagnosis not present

## 2015-03-24 DIAGNOSIS — I10 Essential (primary) hypertension: Secondary | ICD-10-CM | POA: Diagnosis not present

## 2015-03-24 DIAGNOSIS — Z96642 Presence of left artificial hip joint: Secondary | ICD-10-CM | POA: Diagnosis not present

## 2015-03-24 DIAGNOSIS — Z471 Aftercare following joint replacement surgery: Secondary | ICD-10-CM | POA: Diagnosis not present

## 2015-03-24 DIAGNOSIS — Z8546 Personal history of malignant neoplasm of prostate: Secondary | ICD-10-CM | POA: Diagnosis not present

## 2015-03-26 DIAGNOSIS — I10 Essential (primary) hypertension: Secondary | ICD-10-CM | POA: Diagnosis not present

## 2015-03-26 DIAGNOSIS — Z7982 Long term (current) use of aspirin: Secondary | ICD-10-CM | POA: Diagnosis not present

## 2015-03-26 DIAGNOSIS — F419 Anxiety disorder, unspecified: Secondary | ICD-10-CM | POA: Diagnosis not present

## 2015-03-26 DIAGNOSIS — Z96642 Presence of left artificial hip joint: Secondary | ICD-10-CM | POA: Diagnosis not present

## 2015-03-26 DIAGNOSIS — Z8546 Personal history of malignant neoplasm of prostate: Secondary | ICD-10-CM | POA: Diagnosis not present

## 2015-03-26 DIAGNOSIS — M1990 Unspecified osteoarthritis, unspecified site: Secondary | ICD-10-CM | POA: Diagnosis not present

## 2015-03-26 DIAGNOSIS — Z471 Aftercare following joint replacement surgery: Secondary | ICD-10-CM | POA: Diagnosis not present

## 2015-03-30 DIAGNOSIS — M6281 Muscle weakness (generalized): Secondary | ICD-10-CM | POA: Diagnosis not present

## 2015-03-30 DIAGNOSIS — M1990 Unspecified osteoarthritis, unspecified site: Secondary | ICD-10-CM | POA: Diagnosis not present

## 2015-03-30 DIAGNOSIS — R2689 Other abnormalities of gait and mobility: Secondary | ICD-10-CM | POA: Diagnosis not present

## 2015-04-01 DIAGNOSIS — Z510 Encounter for antineoplastic radiation therapy: Secondary | ICD-10-CM | POA: Diagnosis not present

## 2015-04-01 DIAGNOSIS — M6281 Muscle weakness (generalized): Secondary | ICD-10-CM | POA: Diagnosis not present

## 2015-04-01 DIAGNOSIS — R2689 Other abnormalities of gait and mobility: Secondary | ICD-10-CM | POA: Diagnosis not present

## 2015-04-01 DIAGNOSIS — C61 Malignant neoplasm of prostate: Secondary | ICD-10-CM | POA: Diagnosis not present

## 2015-04-01 DIAGNOSIS — M1990 Unspecified osteoarthritis, unspecified site: Secondary | ICD-10-CM | POA: Diagnosis not present

## 2015-04-05 DIAGNOSIS — M1990 Unspecified osteoarthritis, unspecified site: Secondary | ICD-10-CM | POA: Diagnosis not present

## 2015-04-05 DIAGNOSIS — Z79899 Other long term (current) drug therapy: Secondary | ICD-10-CM | POA: Diagnosis not present

## 2015-04-05 DIAGNOSIS — R2689 Other abnormalities of gait and mobility: Secondary | ICD-10-CM | POA: Diagnosis not present

## 2015-04-05 DIAGNOSIS — L438 Other lichen planus: Secondary | ICD-10-CM | POA: Diagnosis not present

## 2015-04-05 DIAGNOSIS — M6281 Muscle weakness (generalized): Secondary | ICD-10-CM | POA: Diagnosis not present

## 2015-04-07 DIAGNOSIS — C61 Malignant neoplasm of prostate: Secondary | ICD-10-CM | POA: Diagnosis not present

## 2015-04-09 DIAGNOSIS — G4733 Obstructive sleep apnea (adult) (pediatric): Secondary | ICD-10-CM | POA: Diagnosis not present

## 2015-04-09 DIAGNOSIS — M353 Polymyalgia rheumatica: Secondary | ICD-10-CM | POA: Diagnosis not present

## 2015-04-09 DIAGNOSIS — M150 Primary generalized (osteo)arthritis: Secondary | ICD-10-CM | POA: Diagnosis not present

## 2015-04-12 DIAGNOSIS — Z510 Encounter for antineoplastic radiation therapy: Secondary | ICD-10-CM | POA: Diagnosis not present

## 2015-04-12 DIAGNOSIS — C61 Malignant neoplasm of prostate: Secondary | ICD-10-CM | POA: Diagnosis not present

## 2015-04-13 DIAGNOSIS — Z510 Encounter for antineoplastic radiation therapy: Secondary | ICD-10-CM | POA: Diagnosis not present

## 2015-04-13 DIAGNOSIS — C61 Malignant neoplasm of prostate: Secondary | ICD-10-CM | POA: Diagnosis not present

## 2015-04-15 DIAGNOSIS — M25552 Pain in left hip: Secondary | ICD-10-CM | POA: Diagnosis not present

## 2015-04-20 DIAGNOSIS — C61 Malignant neoplasm of prostate: Secondary | ICD-10-CM | POA: Diagnosis not present

## 2015-04-20 DIAGNOSIS — Z510 Encounter for antineoplastic radiation therapy: Secondary | ICD-10-CM | POA: Diagnosis not present

## 2015-04-21 DIAGNOSIS — Z510 Encounter for antineoplastic radiation therapy: Secondary | ICD-10-CM | POA: Diagnosis not present

## 2015-04-21 DIAGNOSIS — C61 Malignant neoplasm of prostate: Secondary | ICD-10-CM | POA: Diagnosis not present

## 2015-04-22 DIAGNOSIS — Z510 Encounter for antineoplastic radiation therapy: Secondary | ICD-10-CM | POA: Diagnosis not present

## 2015-04-22 DIAGNOSIS — C61 Malignant neoplasm of prostate: Secondary | ICD-10-CM | POA: Diagnosis not present

## 2015-04-23 DIAGNOSIS — Z510 Encounter for antineoplastic radiation therapy: Secondary | ICD-10-CM | POA: Diagnosis not present

## 2015-04-23 DIAGNOSIS — C61 Malignant neoplasm of prostate: Secondary | ICD-10-CM | POA: Diagnosis not present

## 2015-04-25 DIAGNOSIS — J189 Pneumonia, unspecified organism: Secondary | ICD-10-CM | POA: Diagnosis not present

## 2015-04-26 DIAGNOSIS — C61 Malignant neoplasm of prostate: Secondary | ICD-10-CM | POA: Diagnosis not present

## 2015-04-26 DIAGNOSIS — Z510 Encounter for antineoplastic radiation therapy: Secondary | ICD-10-CM | POA: Diagnosis not present

## 2015-04-27 DIAGNOSIS — C61 Malignant neoplasm of prostate: Secondary | ICD-10-CM | POA: Diagnosis not present

## 2015-04-27 DIAGNOSIS — Z510 Encounter for antineoplastic radiation therapy: Secondary | ICD-10-CM | POA: Diagnosis not present

## 2015-04-28 DIAGNOSIS — C61 Malignant neoplasm of prostate: Secondary | ICD-10-CM | POA: Diagnosis not present

## 2015-04-28 DIAGNOSIS — Z510 Encounter for antineoplastic radiation therapy: Secondary | ICD-10-CM | POA: Diagnosis not present

## 2015-04-29 DIAGNOSIS — C61 Malignant neoplasm of prostate: Secondary | ICD-10-CM | POA: Diagnosis not present

## 2015-04-29 DIAGNOSIS — Z9889 Other specified postprocedural states: Secondary | ICD-10-CM | POA: Diagnosis not present

## 2015-04-29 DIAGNOSIS — Z96642 Presence of left artificial hip joint: Secondary | ICD-10-CM | POA: Diagnosis not present

## 2015-04-29 DIAGNOSIS — Z510 Encounter for antineoplastic radiation therapy: Secondary | ICD-10-CM | POA: Diagnosis not present

## 2015-04-30 DIAGNOSIS — C61 Malignant neoplasm of prostate: Secondary | ICD-10-CM | POA: Diagnosis not present

## 2015-04-30 DIAGNOSIS — Z510 Encounter for antineoplastic radiation therapy: Secondary | ICD-10-CM | POA: Diagnosis not present

## 2015-05-03 DIAGNOSIS — Z510 Encounter for antineoplastic radiation therapy: Secondary | ICD-10-CM | POA: Diagnosis not present

## 2015-05-03 DIAGNOSIS — C61 Malignant neoplasm of prostate: Secondary | ICD-10-CM | POA: Diagnosis not present

## 2015-05-04 DIAGNOSIS — Z510 Encounter for antineoplastic radiation therapy: Secondary | ICD-10-CM | POA: Diagnosis not present

## 2015-05-04 DIAGNOSIS — C61 Malignant neoplasm of prostate: Secondary | ICD-10-CM | POA: Diagnosis not present

## 2015-05-05 DIAGNOSIS — C61 Malignant neoplasm of prostate: Secondary | ICD-10-CM | POA: Diagnosis not present

## 2015-05-05 DIAGNOSIS — Z510 Encounter for antineoplastic radiation therapy: Secondary | ICD-10-CM | POA: Diagnosis not present

## 2015-05-06 DIAGNOSIS — C61 Malignant neoplasm of prostate: Secondary | ICD-10-CM | POA: Diagnosis not present

## 2015-05-06 DIAGNOSIS — Z510 Encounter for antineoplastic radiation therapy: Secondary | ICD-10-CM | POA: Diagnosis not present

## 2015-05-07 DIAGNOSIS — C61 Malignant neoplasm of prostate: Secondary | ICD-10-CM | POA: Diagnosis not present

## 2015-05-07 DIAGNOSIS — Z510 Encounter for antineoplastic radiation therapy: Secondary | ICD-10-CM | POA: Diagnosis not present

## 2015-05-11 DIAGNOSIS — Z510 Encounter for antineoplastic radiation therapy: Secondary | ICD-10-CM | POA: Diagnosis not present

## 2015-05-11 DIAGNOSIS — C61 Malignant neoplasm of prostate: Secondary | ICD-10-CM | POA: Diagnosis not present

## 2015-05-12 DIAGNOSIS — C61 Malignant neoplasm of prostate: Secondary | ICD-10-CM | POA: Diagnosis not present

## 2015-05-12 DIAGNOSIS — Z510 Encounter for antineoplastic radiation therapy: Secondary | ICD-10-CM | POA: Diagnosis not present

## 2015-05-13 DIAGNOSIS — Z510 Encounter for antineoplastic radiation therapy: Secondary | ICD-10-CM | POA: Diagnosis not present

## 2015-05-13 DIAGNOSIS — C61 Malignant neoplasm of prostate: Secondary | ICD-10-CM | POA: Diagnosis not present

## 2015-05-14 DIAGNOSIS — C61 Malignant neoplasm of prostate: Secondary | ICD-10-CM | POA: Diagnosis not present

## 2015-05-14 DIAGNOSIS — Z510 Encounter for antineoplastic radiation therapy: Secondary | ICD-10-CM | POA: Diagnosis not present

## 2015-05-18 DIAGNOSIS — C61 Malignant neoplasm of prostate: Secondary | ICD-10-CM | POA: Diagnosis not present

## 2015-05-18 DIAGNOSIS — Z510 Encounter for antineoplastic radiation therapy: Secondary | ICD-10-CM | POA: Diagnosis not present

## 2015-05-19 DIAGNOSIS — Z510 Encounter for antineoplastic radiation therapy: Secondary | ICD-10-CM | POA: Diagnosis not present

## 2015-05-19 DIAGNOSIS — C61 Malignant neoplasm of prostate: Secondary | ICD-10-CM | POA: Diagnosis not present

## 2015-05-20 DIAGNOSIS — C61 Malignant neoplasm of prostate: Secondary | ICD-10-CM | POA: Diagnosis not present

## 2015-05-20 DIAGNOSIS — Z510 Encounter for antineoplastic radiation therapy: Secondary | ICD-10-CM | POA: Diagnosis not present

## 2015-05-21 DIAGNOSIS — Z510 Encounter for antineoplastic radiation therapy: Secondary | ICD-10-CM | POA: Diagnosis not present

## 2015-05-21 DIAGNOSIS — C61 Malignant neoplasm of prostate: Secondary | ICD-10-CM | POA: Diagnosis not present

## 2015-05-24 DIAGNOSIS — Z510 Encounter for antineoplastic radiation therapy: Secondary | ICD-10-CM | POA: Diagnosis not present

## 2015-05-24 DIAGNOSIS — J189 Pneumonia, unspecified organism: Secondary | ICD-10-CM | POA: Diagnosis not present

## 2015-05-24 DIAGNOSIS — Z96642 Presence of left artificial hip joint: Secondary | ICD-10-CM | POA: Diagnosis not present

## 2015-05-24 DIAGNOSIS — C61 Malignant neoplasm of prostate: Secondary | ICD-10-CM | POA: Diagnosis not present

## 2015-05-25 DIAGNOSIS — Z510 Encounter for antineoplastic radiation therapy: Secondary | ICD-10-CM | POA: Diagnosis not present

## 2015-05-25 DIAGNOSIS — C61 Malignant neoplasm of prostate: Secondary | ICD-10-CM | POA: Diagnosis not present

## 2015-05-26 DIAGNOSIS — C61 Malignant neoplasm of prostate: Secondary | ICD-10-CM | POA: Diagnosis not present

## 2015-05-26 DIAGNOSIS — Z510 Encounter for antineoplastic radiation therapy: Secondary | ICD-10-CM | POA: Diagnosis not present

## 2015-05-27 DIAGNOSIS — Z510 Encounter for antineoplastic radiation therapy: Secondary | ICD-10-CM | POA: Diagnosis not present

## 2015-05-27 DIAGNOSIS — C61 Malignant neoplasm of prostate: Secondary | ICD-10-CM | POA: Diagnosis not present

## 2015-05-28 DIAGNOSIS — C61 Malignant neoplasm of prostate: Secondary | ICD-10-CM | POA: Diagnosis not present

## 2015-05-28 DIAGNOSIS — Z510 Encounter for antineoplastic radiation therapy: Secondary | ICD-10-CM | POA: Diagnosis not present

## 2015-05-31 DIAGNOSIS — Z510 Encounter for antineoplastic radiation therapy: Secondary | ICD-10-CM | POA: Diagnosis not present

## 2015-05-31 DIAGNOSIS — C61 Malignant neoplasm of prostate: Secondary | ICD-10-CM | POA: Diagnosis not present

## 2015-06-01 DIAGNOSIS — Z510 Encounter for antineoplastic radiation therapy: Secondary | ICD-10-CM | POA: Diagnosis not present

## 2015-06-01 DIAGNOSIS — C61 Malignant neoplasm of prostate: Secondary | ICD-10-CM | POA: Diagnosis not present

## 2015-06-02 DIAGNOSIS — Z510 Encounter for antineoplastic radiation therapy: Secondary | ICD-10-CM | POA: Diagnosis not present

## 2015-06-02 DIAGNOSIS — H40003 Preglaucoma, unspecified, bilateral: Secondary | ICD-10-CM | POA: Diagnosis not present

## 2015-06-02 DIAGNOSIS — C61 Malignant neoplasm of prostate: Secondary | ICD-10-CM | POA: Diagnosis not present

## 2015-06-03 DIAGNOSIS — C61 Malignant neoplasm of prostate: Secondary | ICD-10-CM | POA: Diagnosis not present

## 2015-06-03 DIAGNOSIS — Z510 Encounter for antineoplastic radiation therapy: Secondary | ICD-10-CM | POA: Diagnosis not present

## 2015-06-04 DIAGNOSIS — Z510 Encounter for antineoplastic radiation therapy: Secondary | ICD-10-CM | POA: Diagnosis not present

## 2015-06-04 DIAGNOSIS — C61 Malignant neoplasm of prostate: Secondary | ICD-10-CM | POA: Diagnosis not present

## 2015-06-07 DIAGNOSIS — C61 Malignant neoplasm of prostate: Secondary | ICD-10-CM | POA: Diagnosis not present

## 2015-06-07 DIAGNOSIS — Z510 Encounter for antineoplastic radiation therapy: Secondary | ICD-10-CM | POA: Diagnosis not present

## 2015-06-10 DIAGNOSIS — M25552 Pain in left hip: Secondary | ICD-10-CM | POA: Diagnosis not present

## 2015-06-18 DIAGNOSIS — H401132 Primary open-angle glaucoma, bilateral, moderate stage: Secondary | ICD-10-CM | POA: Diagnosis not present

## 2015-07-08 DIAGNOSIS — C61 Malignant neoplasm of prostate: Secondary | ICD-10-CM | POA: Diagnosis not present

## 2015-07-28 DIAGNOSIS — G4733 Obstructive sleep apnea (adult) (pediatric): Secondary | ICD-10-CM | POA: Diagnosis not present

## 2015-08-02 DIAGNOSIS — C61 Malignant neoplasm of prostate: Secondary | ICD-10-CM | POA: Diagnosis not present

## 2015-08-02 DIAGNOSIS — Z0000 Encounter for general adult medical examination without abnormal findings: Secondary | ICD-10-CM | POA: Diagnosis not present

## 2015-10-29 DIAGNOSIS — G4733 Obstructive sleep apnea (adult) (pediatric): Secondary | ICD-10-CM | POA: Diagnosis not present

## 2015-10-29 DIAGNOSIS — M353 Polymyalgia rheumatica: Secondary | ICD-10-CM | POA: Diagnosis not present

## 2015-12-23 DIAGNOSIS — H25813 Combined forms of age-related cataract, bilateral: Secondary | ICD-10-CM | POA: Diagnosis not present

## 2015-12-23 DIAGNOSIS — M7062 Trochanteric bursitis, left hip: Secondary | ICD-10-CM | POA: Diagnosis not present

## 2015-12-29 DIAGNOSIS — Z760 Encounter for issue of repeat prescription: Secondary | ICD-10-CM | POA: Diagnosis not present

## 2015-12-29 DIAGNOSIS — Z79899 Other long term (current) drug therapy: Secondary | ICD-10-CM | POA: Diagnosis not present

## 2015-12-29 DIAGNOSIS — Z0000 Encounter for general adult medical examination without abnormal findings: Secondary | ICD-10-CM | POA: Diagnosis not present

## 2015-12-29 DIAGNOSIS — E782 Mixed hyperlipidemia: Secondary | ICD-10-CM | POA: Diagnosis not present

## 2015-12-29 DIAGNOSIS — Z125 Encounter for screening for malignant neoplasm of prostate: Secondary | ICD-10-CM | POA: Diagnosis not present

## 2015-12-29 DIAGNOSIS — I10 Essential (primary) hypertension: Secondary | ICD-10-CM | POA: Diagnosis not present

## 2015-12-29 DIAGNOSIS — L439 Lichen planus, unspecified: Secondary | ICD-10-CM | POA: Diagnosis not present

## 2016-02-04 DIAGNOSIS — M353 Polymyalgia rheumatica: Secondary | ICD-10-CM | POA: Diagnosis not present

## 2016-02-04 DIAGNOSIS — G4733 Obstructive sleep apnea (adult) (pediatric): Secondary | ICD-10-CM | POA: Diagnosis not present

## 2016-02-04 DIAGNOSIS — C61 Malignant neoplasm of prostate: Secondary | ICD-10-CM | POA: Diagnosis not present

## 2016-02-28 DIAGNOSIS — M47897 Other spondylosis, lumbosacral region: Secondary | ICD-10-CM | POA: Diagnosis not present

## 2016-02-28 DIAGNOSIS — G4700 Insomnia, unspecified: Secondary | ICD-10-CM | POA: Diagnosis not present

## 2016-03-01 DIAGNOSIS — Z23 Encounter for immunization: Secondary | ICD-10-CM | POA: Diagnosis not present

## 2016-04-19 DIAGNOSIS — H401132 Primary open-angle glaucoma, bilateral, moderate stage: Secondary | ICD-10-CM | POA: Diagnosis not present

## 2016-05-07 DIAGNOSIS — M353 Polymyalgia rheumatica: Secondary | ICD-10-CM | POA: Diagnosis not present

## 2016-05-07 DIAGNOSIS — G4733 Obstructive sleep apnea (adult) (pediatric): Secondary | ICD-10-CM | POA: Diagnosis not present

## 2016-06-22 DIAGNOSIS — H401132 Primary open-angle glaucoma, bilateral, moderate stage: Secondary | ICD-10-CM | POA: Diagnosis not present

## 2016-08-08 DIAGNOSIS — G4733 Obstructive sleep apnea (adult) (pediatric): Secondary | ICD-10-CM | POA: Diagnosis not present

## 2016-08-08 DIAGNOSIS — M353 Polymyalgia rheumatica: Secondary | ICD-10-CM | POA: Diagnosis not present

## 2016-08-16 DIAGNOSIS — Z01818 Encounter for other preprocedural examination: Secondary | ICD-10-CM | POA: Diagnosis not present

## 2016-09-07 DIAGNOSIS — K635 Polyp of colon: Secondary | ICD-10-CM | POA: Diagnosis not present

## 2016-09-07 DIAGNOSIS — Z86010 Personal history of colonic polyps: Secondary | ICD-10-CM | POA: Diagnosis not present

## 2016-09-07 DIAGNOSIS — Z1211 Encounter for screening for malignant neoplasm of colon: Secondary | ICD-10-CM | POA: Diagnosis not present

## 2016-09-07 DIAGNOSIS — Z79899 Other long term (current) drug therapy: Secondary | ICD-10-CM | POA: Diagnosis not present

## 2016-09-07 DIAGNOSIS — Z7982 Long term (current) use of aspirin: Secondary | ICD-10-CM | POA: Diagnosis not present

## 2016-09-07 DIAGNOSIS — Z96649 Presence of unspecified artificial hip joint: Secondary | ICD-10-CM | POA: Diagnosis not present

## 2016-11-09 DIAGNOSIS — M353 Polymyalgia rheumatica: Secondary | ICD-10-CM | POA: Diagnosis not present

## 2016-11-09 DIAGNOSIS — G4733 Obstructive sleep apnea (adult) (pediatric): Secondary | ICD-10-CM | POA: Diagnosis not present

## 2017-01-01 DIAGNOSIS — Z125 Encounter for screening for malignant neoplasm of prostate: Secondary | ICD-10-CM | POA: Diagnosis not present

## 2017-01-01 DIAGNOSIS — Z79899 Other long term (current) drug therapy: Secondary | ICD-10-CM | POA: Diagnosis not present

## 2017-01-01 DIAGNOSIS — E782 Mixed hyperlipidemia: Secondary | ICD-10-CM | POA: Diagnosis not present

## 2017-01-03 DIAGNOSIS — G4733 Obstructive sleep apnea (adult) (pediatric): Secondary | ICD-10-CM | POA: Diagnosis not present

## 2017-01-03 DIAGNOSIS — E782 Mixed hyperlipidemia: Secondary | ICD-10-CM | POA: Diagnosis not present

## 2017-01-03 DIAGNOSIS — Z0000 Encounter for general adult medical examination without abnormal findings: Secondary | ICD-10-CM | POA: Diagnosis not present

## 2017-01-03 DIAGNOSIS — I10 Essential (primary) hypertension: Secondary | ICD-10-CM | POA: Diagnosis not present

## 2017-01-08 DIAGNOSIS — H401132 Primary open-angle glaucoma, bilateral, moderate stage: Secondary | ICD-10-CM | POA: Diagnosis not present

## 2017-01-24 DIAGNOSIS — H25812 Combined forms of age-related cataract, left eye: Secondary | ICD-10-CM | POA: Diagnosis not present

## 2017-01-24 DIAGNOSIS — H25811 Combined forms of age-related cataract, right eye: Secondary | ICD-10-CM | POA: Diagnosis not present

## 2017-01-24 DIAGNOSIS — H353132 Nonexudative age-related macular degeneration, bilateral, intermediate dry stage: Secondary | ICD-10-CM | POA: Diagnosis not present

## 2017-02-02 DIAGNOSIS — C61 Malignant neoplasm of prostate: Secondary | ICD-10-CM | POA: Diagnosis not present

## 2017-02-07 DIAGNOSIS — H25812 Combined forms of age-related cataract, left eye: Secondary | ICD-10-CM | POA: Diagnosis not present

## 2017-02-07 DIAGNOSIS — H25811 Combined forms of age-related cataract, right eye: Secondary | ICD-10-CM | POA: Diagnosis not present

## 2017-02-07 DIAGNOSIS — H40053 Ocular hypertension, bilateral: Secondary | ICD-10-CM | POA: Diagnosis not present

## 2017-02-07 DIAGNOSIS — H40052 Ocular hypertension, left eye: Secondary | ICD-10-CM | POA: Diagnosis not present

## 2017-02-10 DIAGNOSIS — M353 Polymyalgia rheumatica: Secondary | ICD-10-CM | POA: Diagnosis not present

## 2017-02-10 DIAGNOSIS — G4733 Obstructive sleep apnea (adult) (pediatric): Secondary | ICD-10-CM | POA: Diagnosis not present

## 2017-02-26 DIAGNOSIS — Z23 Encounter for immunization: Secondary | ICD-10-CM | POA: Diagnosis not present

## 2017-03-07 DIAGNOSIS — H40051 Ocular hypertension, right eye: Secondary | ICD-10-CM | POA: Diagnosis not present

## 2017-03-07 DIAGNOSIS — H40053 Ocular hypertension, bilateral: Secondary | ICD-10-CM | POA: Diagnosis not present

## 2017-03-07 DIAGNOSIS — H25811 Combined forms of age-related cataract, right eye: Secondary | ICD-10-CM | POA: Diagnosis not present

## 2017-03-07 DIAGNOSIS — H2511 Age-related nuclear cataract, right eye: Secondary | ICD-10-CM | POA: Diagnosis not present

## 2017-03-20 IMAGING — CR DG PORTABLE PELVIS
1 series · 1 of 1 positions shown · non-contrast
Comparison: None.

CLINICAL DATA: Postop left hip arthroplasty

EXAM:
PORTABLE PELVIS 1-2 VIEWS

[AP]
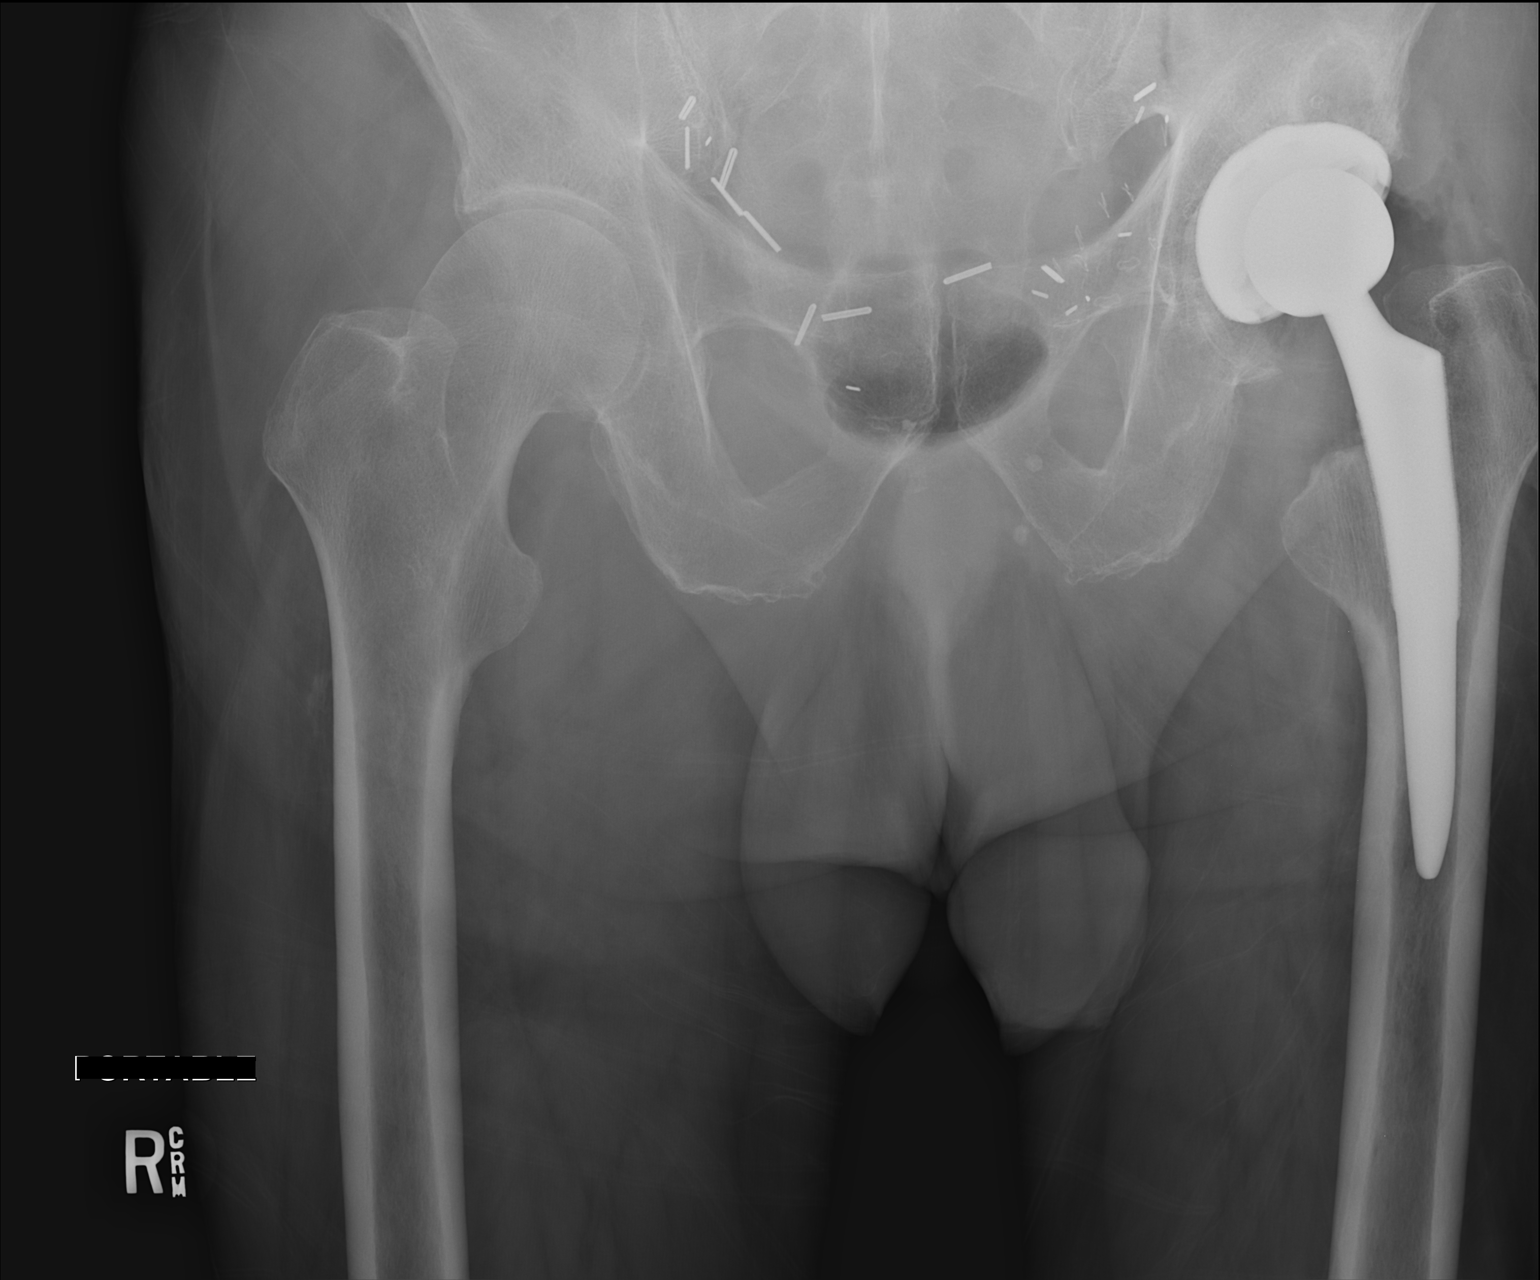

[1 of 1 positions shown; findings below may reference images not displayed]

FINDINGS: Acetabular and femoral components of left hip arthroplasty in
anticipated position. Anticipated postoperative soft tissue change
over the left hip. Mild arthritic change of the right hip joint.
IMPRESSION: Anticipated postoperative appearance

## 2017-03-20 IMAGING — RF DG HIP (WITH PELVIS) OPERATIVE*L*
1 series · 2 of 2 positions shown · non-contrast
Comparison: None.

CLINICAL DATA: 73-year-old male status post left hip arthroplasty
via anterior approach

EXAM:
OPERATIVE LEFT HIP (WITH PELVIS IF PERFORMED) 2 VIEWS
TECHNIQUE: Fluoroscopic spot image(s) were submitted for interpretation
post-operatively.

[Series 1: run · 2 of 2 slices shown]
[im 1/2]
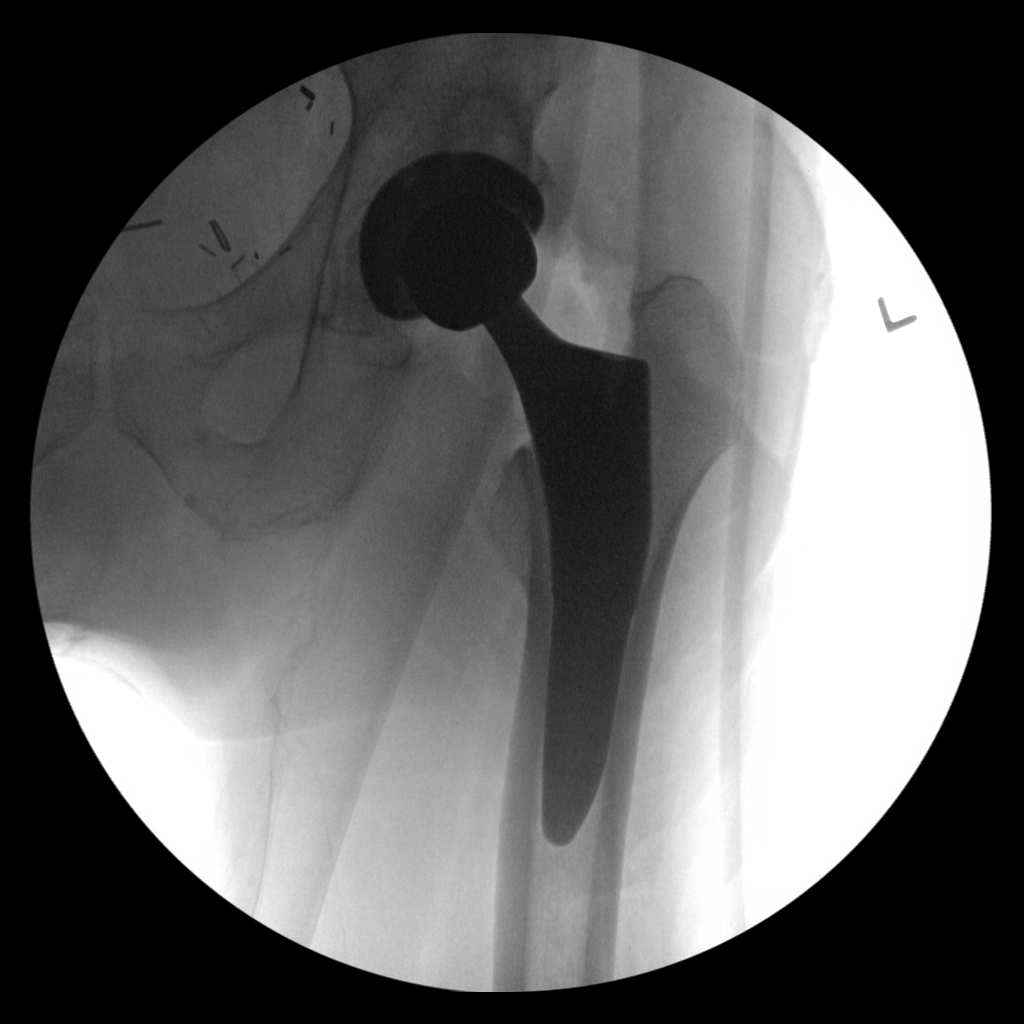
[im 2/2]
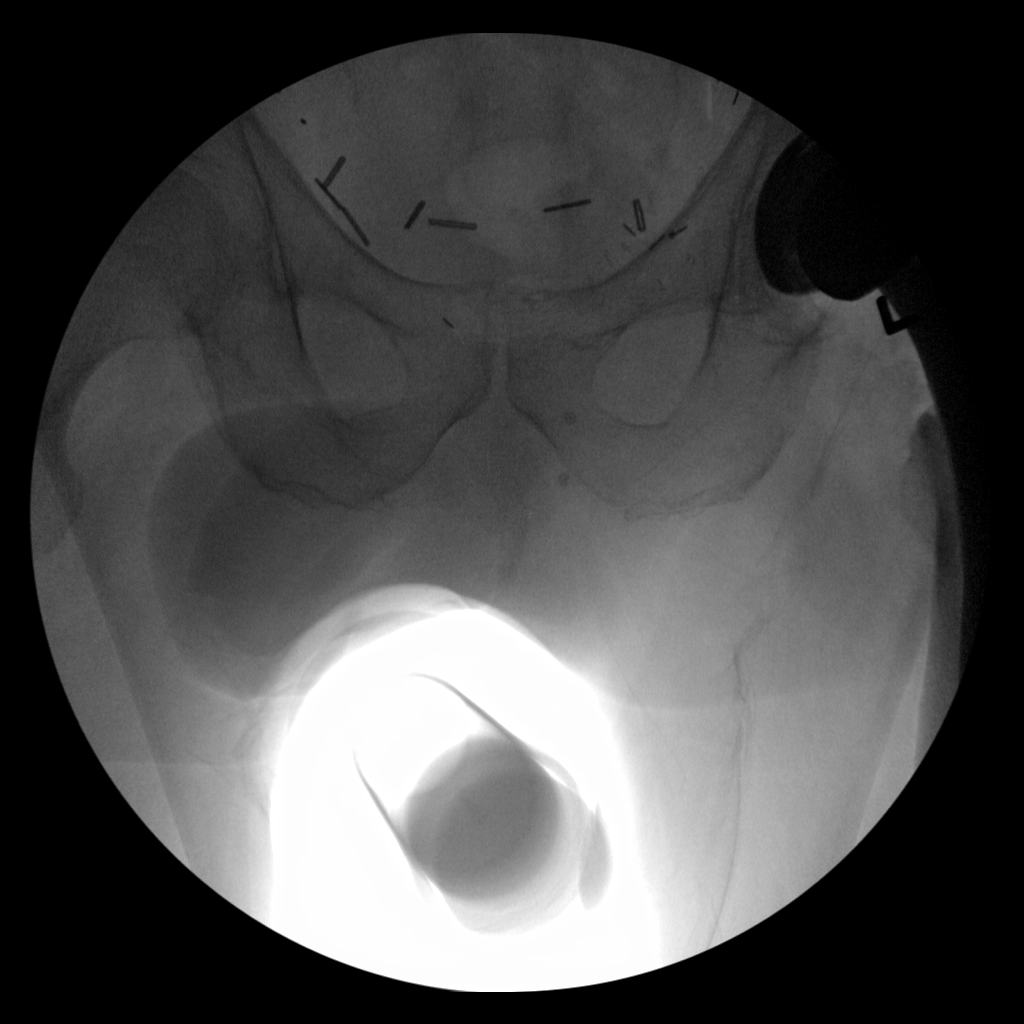

[2 of 2 positions shown; findings below may reference images not displayed]

FINDINGS: Interval surgical changes of left hip arthroplasty. No evidence of
acute hardware complication. Surgical clips project over the
anatomic pelvis. Soft tissue defect consistent with surgical
incision.
IMPRESSION: Left hip arthroplasty without evidence of complicating feature.

## 2017-03-29 DIAGNOSIS — Z961 Presence of intraocular lens: Secondary | ICD-10-CM | POA: Diagnosis not present

## 2017-06-01 DIAGNOSIS — G4733 Obstructive sleep apnea (adult) (pediatric): Secondary | ICD-10-CM | POA: Diagnosis not present

## 2017-06-01 DIAGNOSIS — M150 Primary generalized (osteo)arthritis: Secondary | ICD-10-CM | POA: Diagnosis not present

## 2017-06-01 DIAGNOSIS — M353 Polymyalgia rheumatica: Secondary | ICD-10-CM | POA: Diagnosis not present

## 2017-07-25 DIAGNOSIS — H401132 Primary open-angle glaucoma, bilateral, moderate stage: Secondary | ICD-10-CM | POA: Diagnosis not present

## 2017-08-09 DIAGNOSIS — M7751 Other enthesopathy of right foot: Secondary | ICD-10-CM | POA: Diagnosis not present

## 2017-08-23 DIAGNOSIS — M779 Enthesopathy, unspecified: Secondary | ICD-10-CM | POA: Diagnosis not present

## 2017-09-02 DIAGNOSIS — M353 Polymyalgia rheumatica: Secondary | ICD-10-CM | POA: Diagnosis not present

## 2017-09-02 DIAGNOSIS — G4733 Obstructive sleep apnea (adult) (pediatric): Secondary | ICD-10-CM | POA: Diagnosis not present

## 2017-09-02 DIAGNOSIS — M150 Primary generalized (osteo)arthritis: Secondary | ICD-10-CM | POA: Diagnosis not present

## 2017-09-25 DIAGNOSIS — M2041 Other hammer toe(s) (acquired), right foot: Secondary | ICD-10-CM | POA: Diagnosis not present

## 2017-09-25 DIAGNOSIS — M779 Enthesopathy, unspecified: Secondary | ICD-10-CM | POA: Diagnosis not present

## 2017-12-13 DIAGNOSIS — M150 Primary generalized (osteo)arthritis: Secondary | ICD-10-CM | POA: Diagnosis not present

## 2017-12-13 DIAGNOSIS — M353 Polymyalgia rheumatica: Secondary | ICD-10-CM | POA: Diagnosis not present

## 2017-12-13 DIAGNOSIS — G4733 Obstructive sleep apnea (adult) (pediatric): Secondary | ICD-10-CM | POA: Diagnosis not present

## 2017-12-31 DIAGNOSIS — Z125 Encounter for screening for malignant neoplasm of prostate: Secondary | ICD-10-CM | POA: Diagnosis not present

## 2017-12-31 DIAGNOSIS — E782 Mixed hyperlipidemia: Secondary | ICD-10-CM | POA: Diagnosis not present

## 2017-12-31 DIAGNOSIS — Z79899 Other long term (current) drug therapy: Secondary | ICD-10-CM | POA: Diagnosis not present

## 2018-01-04 DIAGNOSIS — I10 Essential (primary) hypertension: Secondary | ICD-10-CM | POA: Diagnosis not present

## 2018-01-04 DIAGNOSIS — Z0000 Encounter for general adult medical examination without abnormal findings: Secondary | ICD-10-CM | POA: Diagnosis not present

## 2018-01-04 DIAGNOSIS — G4733 Obstructive sleep apnea (adult) (pediatric): Secondary | ICD-10-CM | POA: Diagnosis not present

## 2018-01-04 DIAGNOSIS — E782 Mixed hyperlipidemia: Secondary | ICD-10-CM | POA: Diagnosis not present

## 2018-01-25 DIAGNOSIS — M353 Polymyalgia rheumatica: Secondary | ICD-10-CM | POA: Diagnosis not present

## 2018-01-25 DIAGNOSIS — G4733 Obstructive sleep apnea (adult) (pediatric): Secondary | ICD-10-CM | POA: Diagnosis not present

## 2018-01-25 DIAGNOSIS — M150 Primary generalized (osteo)arthritis: Secondary | ICD-10-CM | POA: Diagnosis not present

## 2018-01-29 DIAGNOSIS — H401132 Primary open-angle glaucoma, bilateral, moderate stage: Secondary | ICD-10-CM | POA: Diagnosis not present

## 2018-02-24 DIAGNOSIS — M353 Polymyalgia rheumatica: Secondary | ICD-10-CM | POA: Diagnosis not present

## 2018-02-24 DIAGNOSIS — G4733 Obstructive sleep apnea (adult) (pediatric): Secondary | ICD-10-CM | POA: Diagnosis not present

## 2018-02-24 DIAGNOSIS — M150 Primary generalized (osteo)arthritis: Secondary | ICD-10-CM | POA: Diagnosis not present

## 2018-03-07 ENCOUNTER — Telehealth: Payer: Self-pay | Admitting: Neurology

## 2018-03-07 ENCOUNTER — Encounter: Payer: Self-pay | Admitting: Neurology

## 2018-03-07 ENCOUNTER — Ambulatory Visit: Payer: Medicare Other | Admitting: Neurology

## 2018-03-07 VITALS — BP 138/86 | HR 77 | Ht 69.0 in | Wt 215.0 lb

## 2018-03-07 DIAGNOSIS — M545 Low back pain, unspecified: Secondary | ICD-10-CM

## 2018-03-07 MED ORDER — GABAPENTIN 600 MG PO TABS: 600.0000 mg | ORAL_TABLET | Freq: Three times a day (TID) | ORAL | 11 refills | Status: DC

## 2018-03-07 MED ORDER — GABAPENTIN 600 MG PO TABS: 900.0000 mg | ORAL_TABLET | Freq: Three times a day (TID) | ORAL | 11 refills | Status: DC

## 2018-03-07 NOTE — Telephone Encounter (Signed)
He wants MRI lumbar at [facility]

## 2018-03-07 NOTE — Progress Notes (Signed)
PATIENT: Chad Smith DOB: 02-11-1941  Chief Complaint  Patient presents with  . Lumbar Sponylosis    Reports chronic low back pain radiating into his bilateral legs, ankles and toes. He is currently taking gabapentin 900mg , four times daily.  He was previously seen by Chad Smith.  He is here to establish new care.  Marland Kitchen PCP    Chad Peer, MD - referring MD  . Neurosurgeon    Chad Sheng, MD     HISTORICAL  Chad Smith is a 77 year old male, seen in request by his primary care physician Dr. Nicki Reaper, Chad Smith for evaluation of low back pain, initial evaluation was on March 08, 2018  I have reviewed and summarized the referring note from the referring physician.  History of hypertension, hyperlipidemia, history of left hip replacement, also had a history of lumbar decompression surgery by Dr. Katherine Roan in July 2014, prior to the surgery, had a significant low back pain, radiating pain to bilateral lower extremity, he had bilateral L5-S1 hemilaminectomy, foraminotomies, which did help his symptoms significantly, he has much less low back pain, for few years, he was doing very well, but since 2018, he began to have intermittent low back pain when bearing weight, after prolonged standing or walking, in addition, he complains of frequent vague bilateral anterior shin area deep achy pain, tender sensation, tightness, has difficulty going to sleep sometimes, he has been taking gabapentin 600 mg 1 and half tablets up to 4 times a day, also taking hydroxyzine 50 mg to sleep,  He was previously under the care of [facility] neurologist Chad Smith, I reviewed the record, he tried Elavil in the past, but is no longer taking it, with his worsening low back pain, he desires further evaluations  He denies bowel and bladder incontinence,  REVIEW OF SYSTEMS: Full 14 system review of systems performed and notable only for as above All other review of systems were  negative.  ALLERGIES: No Known Allergies  HOME MEDICATIONS: Current Outpatient Medications  Medication Sig Dispense Refill  . aspirin 81 MG tablet Take 81 mg by mouth daily.    Marland Kitchen Smith complex vitamins tablet Take 1 tablet by mouth daily.    . Calcium Carbonate-Vit D-Min (CALCIUM 1200 PO) Take 1 tablet by mouth daily.     . cholecalciferol (VITAMIN D) 1000 UNITS tablet Take 1,000 Units by mouth daily.    Marland Kitchen ezetimibe (ZETIA) 10 MG tablet Take 10 mg by mouth daily.    Marland Kitchen gabapentin (NEURONTIN) 600 MG tablet Take 900 mg by mouth 4 (four) times daily as needed.     . Glucosamine-Chondroit-Vit C-Mn (GLUCOSAMINE 1500 COMPLEX PO) Take 1 tablet by mouth daily.     . hydrOXYzine (ATARAX/VISTARIL) 50 MG tablet TAKE 1 - 2 TABLETS BY MOUTH AT NIGHT FOR INSOMNIA  2  . Lactobacillus (PROBIOTIC ACIDOPHILUS PO) Take 1 capsule by mouth daily.    Marland Kitchen losartan (COZAAR) 100 MG tablet Take 100 mg by mouth daily.    . Multiple Vitamins-Minerals (MULTIVITAMIN WITH MINERALS) tablet Take 1 tablet by mouth daily.    Marland Kitchen oxymetazoline (AFRIN) 0.05 % nasal spray Place 1 spray into both nostrils 2 (two) times daily as needed for congestion.    . simvastatin (ZOCOR) 40 MG tablet Take 40 mg by mouth daily.    . travoprost, benzalkonium, (TRAVATAN) 0.004 % ophthalmic solution 1 drop at bedtime.    . vitamin A 10000 UNIT capsule Take 10,000 Units by mouth daily.    Marland Kitchen  vitamin E 400 UNIT capsule Take 400 Units by mouth daily.    . Zinc Sulfate (ZINC 15 PO) Take 1 tablet by mouth daily.     No current facility-administered medications for this visit.     PAST MEDICAL HISTORY: Past Medical History:  Diagnosis Date  . Anxiety   . Arthritis   . Hyperlipemia   . Hypertension   . Lumbar spondylosis   . Prostate cancer ([place]) 2000  . Sleep apnea    v pap    PAST SURGICAL HISTORY: Past Surgical History:  Procedure Laterality Date  . ANKLE FRACTURE SURGERY    . APPENDECTOMY    . BACK SURGERY    . HERNIA REPAIR    .  PROSTATE SURGERY    . TOTAL HIP ARTHROPLASTY Left 03/08/2015   Procedure: TOTAL HIP ARTHROPLASTY ANTERIOR APPROACH;  Surgeon: Frederik Pear, MD;  Location: [facility];  Service: Orthopedics;  Laterality: Left;    FAMILY HISTORY: Family History  Problem Relation Age of Onset  . Stroke Mother   . Prostate cancer Father   . Breast cancer Sister     SOCIAL HISTORY: Social History   Socioeconomic History  . Marital status: Married    Spouse name: Not on file  . Number of children: 2  . Years of education: college  . Highest education level: Bachelor's degree (e.g., BA, AB, BS)  Occupational History  . Occupation: Retired Immunologist)  Social Needs  . Financial resource strain: Not on file  . Food insecurity:    Worry: Not on file    Inability: Not on file  . Transportation needs:    Medical: Not on file    Non-medical: Not on file  Tobacco Use  . Smoking status: Never Smoker  . Smokeless tobacco: Never Used  Substance and Sexual Activity  . Alcohol use: Yes    Alcohol/week: 0.0 standard drinks    Comment: one beer per month  . Drug use: No  . Sexual activity: Not on file  Lifestyle  . Physical activity:    Days per week: Not on file    Minutes per session: Not on file  . Stress: Not on file  Relationships  . Social connections:    Talks on phone: Not on file    Gets together: Not on file    Attends religious service: Not on file    Active member of club or organization: Not on file    Attends meetings of clubs or organizations: Not on file    Relationship status: Not on file  . Intimate partner violence:    Fear of current or ex partner: Not on file    Emotionally abused: Not on file    Physically abused: Not on file    Forced sexual activity: Not on file  Other Topics Concern  . Not on file  Social History Narrative   Lives at home with his wife, Chad Smith.   Right-handed.   No caffeine use.     PHYSICAL EXAM   Vitals:   03/07/18 1003  BP:  138/86  Pulse: 77  Weight: 215 lb (97.5 kg)  Height: 5\' 9"  (1.753 m)    Not recorded      Body mass index is 31.75 kg/m.  PHYSICAL EXAMNIATION:  Gen: NAD, conversant, well nourised, obese, well groomed                     Cardiovascular: Regular rate rhythm, no peripheral edema, warm,  nontender. Eyes: Conjunctivae clear without exudates or hemorrhage Neck: Supple, no carotid bruits. Pulmonary: Clear to auscultation bilaterally   NEUROLOGICAL EXAM:  MENTAL STATUS: Speech:    Speech is normal; fluent and spontaneous with normal comprehension.  Cognition:     Orientation to time, place and person     Normal recent and remote memory     Normal Attention span and concentration     Normal Language, naming, repeating,spontaneous speech     Fund of knowledge   CRANIAL NERVES: CN II: Visual fields are full to confrontation. Fundoscopic exam is normal with sharp discs and no vascular changes. Pupils are round equal and briskly reactive to light. CN III, IV, VI: extraocular movement are normal. No ptosis. CN V: Facial sensation is intact to pinprick in all 3 divisions bilaterally. Corneal responses are intact.  CN VII: Face is symmetric with normal eye closure and smile. CN VIII: Hearing is normal to rubbing fingers CN IX, X: Palate elevates symmetrically. Phonation is normal. CN XI: Head turning and shoulder shrug are intact CN XII: Tongue is midline with normal movements and no atrophy.  MOTOR: There is no pronator drift of out-stretched arms. Muscle bulk and tone are normal. Muscle strength is normal.  REFLEXES: Reflexes are 2+ and symmetric at the biceps, triceps, knees, and ankles. Plantar responses are flexor.  SENSORY: Length dependent decreased to light touch, pinprick and vibratory sensation to ankle levels  COORDINATION: Rapid alternating movements and fine finger movements are intact. There is no dysmetria on finger-to-nose and heel-knee-shin.     GAIT/STANCE: Needs pushed up to get up from seated position, antalgic, cautious, Romberg is absent.   DIAGNOSTIC DATA (LABS, IMAGING, TESTING) - I reviewed patient records, labs, notes, testing and imaging myself where available.   ASSESSMENT AND PLAN  STEVON GOUGH is a 77 y.o. male    Chronic low back pain, bilateral lower extremity pain,  Differentiation diagnosis including lumbar stenosis versus superimposed peripheral neuropathy  We will try to decrease gabapentin to 600 mg 3 times a day to avoid the side effect  Continue moderate exercise , Marcial Pacas, M.D. Ph.D.  [facility] [address], [place], [place] [address] Ph: [phone] Fax: [phone]  CC:  Chad Peer, MD

## 2018-03-07 NOTE — Telephone Encounter (Signed)
Patient is scheduled at [facility] for 03/13/18 arrival time is 2 pm. Patient is aware of time & day if he needed to r/s their number is [phone].

## 2018-03-07 NOTE — Telephone Encounter (Signed)
[employer] medicare order sent to GI. No auth they will reach out to the pt to schedule.  °

## 2018-03-07 NOTE — Telephone Encounter (Signed)
Patient is scheduled at [facility] for 03/13/18 arrival time is 2 pm. Patient is aware of time & day if he needed to r/s their number is [phone].

## 2018-03-07 NOTE — Telephone Encounter (Signed)
Noted  

## 2018-03-08 DIAGNOSIS — Z23 Encounter for immunization: Secondary | ICD-10-CM | POA: Diagnosis not present

## 2018-03-13 ENCOUNTER — Encounter: Payer: Self-pay | Admitting: Neurology

## 2018-03-13 DIAGNOSIS — M545 Low back pain: Secondary | ICD-10-CM | POA: Diagnosis not present

## 2018-03-26 ENCOUNTER — Telehealth: Payer: Self-pay | Admitting: Neurology

## 2018-03-26 NOTE — Telephone Encounter (Signed)
Please call patient, MRI of the lumbar spine showed multilevel degenerative changes, chronic severe disc degeneration, throughout lower thoracic and lumbar spine, compared to 2014, there is progression of mild to moderate multifactorial spinal stenosis at L3 and 4, L2-3, L1-L2, associated moderate to severe left lateral recess stenosis, moderate to severe neuroforaminal stenosis L2-5, also evidence of lower thoracic spinal cord myelomalacia at T11-12

## 2018-03-26 NOTE — Telephone Encounter (Signed)
Spoke to patient - he is aware of his MRI results and will keep his pending NCV/EMG appt on 04/19/18 for further review.

## 2018-03-27 DIAGNOSIS — M150 Primary generalized (osteo)arthritis: Secondary | ICD-10-CM | POA: Diagnosis not present

## 2018-03-27 DIAGNOSIS — G4733 Obstructive sleep apnea (adult) (pediatric): Secondary | ICD-10-CM | POA: Diagnosis not present

## 2018-03-27 DIAGNOSIS — M353 Polymyalgia rheumatica: Secondary | ICD-10-CM | POA: Diagnosis not present

## 2018-04-19 ENCOUNTER — Other Ambulatory Visit: Payer: Self-pay | Admitting: Neurology

## 2018-04-19 ENCOUNTER — Ambulatory Visit: Payer: Medicare Other | Admitting: Neurology

## 2018-04-19 ENCOUNTER — Ambulatory Visit (INDEPENDENT_AMBULATORY_CARE_PROVIDER_SITE_OTHER): Payer: Medicare Other | Admitting: Neurology

## 2018-04-19 DIAGNOSIS — M545 Low back pain, unspecified: Secondary | ICD-10-CM

## 2018-04-19 DIAGNOSIS — M5410 Radiculopathy, site unspecified: Secondary | ICD-10-CM

## 2018-04-19 NOTE — Procedures (Signed)
Full Name: Chad Smith Gender: Male MRN #: [id number] Date of Birth: [birth year]    Visit Date: 04/19/2018 10:20 Age: 77 Years 0 Months Old Examining Physician: Marcial Pacas, MD  Referring Physician: Krista Blue, MD History: 77 year old male, with history of lumbar decompression surgery, left hip surgery, presenting with recurrent low back pain, radiating pain to bilateral lower extremity, gait abnormality.  Summary of the tests:  Nerve conduction study: Bilateral sural, superficial peroneal sensory responses were normal.  Bilateral peroneal to EDB, tibial motor responses were normal.  Bilateral tibial H reflexes were present and symmetric.  Electromyography: Selective needle examinations were performed at bilateral lower extremity muscles and bilateral lumbosacral paraspinal muscles. There is evidence of chronic neuropathic changes involving bilateral tibialis anterior, tibialis posterior, peroneal longus, medial gastrocnemius, vastus lateralis.  There is no evidence of active denervation at bilateral lumbosacral paraspinal muscles, but there is evidence of mildly enlarged complex motor unit potential  Conclusion: This is an abnormal study.  There is electrodiagnostic evidence of bilateral lumbosacral radiculopathy, mainly involving bilateral L4-5 S1 myotomes.  There is no evidence of active process.    ------------------------------- Marcial Pacas, M.D. PhD  [facility] [place], [place] [address] Tel: [phone] Fax: [phone]        [facility]    Nerve / Sites Muscle Latency Ref. Amplitude Ref. Rel Amp Segments Distance Velocity Ref. Area    ms ms mV mV %  cm m/s m/s mVms  R Peroneal - EDB     Ankle EDB 4.7 ?6.5 6.4 ?2.0 100 Ankle - EDB 9   17.5     Fib head EDB 11.9  5.9  93 Fib head - Ankle 32 45 ?44 15.5     Pop fossa EDB 14.1  6.2  105 Pop fossa - Fib head 10 45 ?44 16.0         Pop fossa - Ankle      L Peroneal - EDB     Ankle EDB 5.7 ?6.5  3.5 ?2.0 100 Ankle - EDB 9   10.4     Fib head EDB 12.7  2.9  82.8 Fib head - Ankle 32 46 ?44 9.8     Pop fossa EDB 14.9  2.9  98.1 Pop fossa - Fib head 10 45 ?44 9.6         Pop fossa - Ankle      R Tibial - AH     Ankle AH 4.7 ?5.8 6.9 ?4.0 100 Ankle - AH 9   16.0     Pop fossa AH 14.0  4.9  70.4 Pop fossa - Ankle 38 41 ?41 14.1  L Tibial - AH     Ankle AH 4.1 ?5.8 9.1 ?4.0 100 Ankle - AH 9   24.4     Pop fossa AH 13.3  7.6  84.2 Pop fossa - Ankle 38 41 ?41 20.7             SNC    Nerve / Sites Rec. Site Peak Lat Ref.  Amp Ref. Segments Distance    ms ms V V  cm  R Sural - Ankle (Calf)     Calf Ankle 3.2 ?4.4 10 ?6 Calf - Ankle 14  L Sural - Ankle (Calf)     Calf Ankle 3.8 ?4.4 6 ?6 Calf - Ankle 14  R Superficial peroneal - Ankle     Lat leg Ankle 4.2 ?4.4 6 ?6 Lat leg -  Ankle 14  L Superficial peroneal - Ankle     Lat leg Ankle 3.6 ?4.4 5 ?6 Lat leg - Ankle 14              F  Wave    Nerve F Lat Ref.   ms ms  R Tibial - AH 53.8 ?56.0  L Tibial - AH 52.9 ?56.0           EMG full       EMG Summary Table    Spontaneous MUAP Recruitment  Muscle IA Fib PSW Fasc Other Amp Dur. Poly Pattern  R. Tibialis anterior Increased 1+ None None _______ Increased Increased Normal Reduced  R. Tibialis posterior Increased None None None _______ Increased Increased Normal Reduced  R. Peroneus longus Increased None None None _______ Increased Increased Normal Reduced  R. Gastrocnemius (Medial head) Increased None None None _______ Increased Increased Normal Reduced  R. Vastus lateralis Normal None None None _______ Normal Increased Normal Reduced  R. Biceps femoris (short head) Increased None None None _______ Increased Increased Normal Reduced  R. Lumbar paraspinals (mid) Increased None None None _______ Increased Increased 1+ Normal  R. Lumbar paraspinals (low) Increased None None None _______ Increased Increased 1+ Normal  L. Tibialis anterior Increased None None None _______ Normal  Normal Normal Reduced  L. Tibialis posterior Normal None None None _______ Normal Normal Normal Reduced  L. Peroneus longus Normal None None None _______ Normal Normal Normal Reduced  L. Gastrocnemius (Medial head) Normal None None None _______ Increased Increased 1+ Reduced  L. Vastus lateralis Normal None None None _______ Increased Normal Normal Reduced  L. Lumbar paraspinals (mid) Increased 1+ None None _______ Normal Normal Normal Normal  L. Lumbar paraspinals (low) Increased 1+ None None _______ Normal Normal Normal Normal

## 2018-04-19 NOTE — Progress Notes (Signed)
PATIENT: Chad Smith DOB: [birth year]  No chief complaint on file.    HISTORICAL  FRANCHOT POLLITT is a 77 year old male, seen in request by his primary care physician Dr. Nicki Reaper, [facility] [place] [facility] for evaluation of low back pain, initial evaluation was on March 08, 2018  I have reviewed and summarized the referring note from the referring physician.  History of hypertension, hyperlipidemia, history of left hip replacement, also had a history of lumbar decompression surgery by Dr. Katherine Roan in July 2014, prior to the surgery, had a significant low back pain, radiating pain to bilateral lower extremity, he had bilateral L5-S1 hemilaminectomy, foraminotomies, which did help his symptoms significantly, he has much less low back pain, for few years, he was doing very well, but since 2018, he began to have intermittent low back pain when bearing weight, after prolonged standing or walking, in addition, he complains of frequent vague bilateral anterior shin area deep achy pain, tender sensation, tightness, has difficulty going to sleep sometimes, he has been taking gabapentin 600 mg 1 and half tablets up to 4 times a day, also taking hydroxyzine 50 mg to sleep,  He was previously under the care of [facility] neurologist Dr. Metta Clines, I reviewed the record, he tried Elavil in the past, but is no longer taking it, with his worsening low back pain, he desires further evaluations  He denies bowel and bladder incontinence,  UPDATE Dec 6th 2019: He continues to have mild chronic low back pain, mild gait abnormality,  We have personally reviewed MRI of lumbar spine: MRI of the lumbar spine showed multilevel degenerative changes, chronic severe disc degeneration, throughout lower thoracic and lumbar spine, compared to 2014, there is progression of mild to moderate multifactorial spinal stenosis at L3 and 4, L2-3, L1-L2, associated moderate to severe left lateral recess stenosis, moderate to severe neuroforaminal  stenosis L2-5, also evidence of lower thoracic spinal cord myelomalacia at T11-12  He will return for electrodiagnostic study today, which showed evidence of chronic bilateral lumbosacral radiculopathy,  there is no evidence of active process  He is on lower dose of gabapentin 600 mg 3 times a day, it was decreased from 3600 mg daily, lower dose does help his symptoms some  REVIEW OF SYSTEMS: Full 14 system review of systems performed and notable only for as above All other review of systems were negative.  ALLERGIES: No Known Allergies  HOME MEDICATIONS: Current Outpatient Medications  Medication Sig Dispense Refill  . aspirin 81 MG tablet Take 81 mg by mouth daily.    Marland Kitchen b complex vitamins tablet Take 1 tablet by mouth daily.    . Calcium Carbonate-Vit D-Min (CALCIUM 1200 PO) Take 1 tablet by mouth daily.     . cholecalciferol (VITAMIN D) 1000 UNITS tablet Take 1,000 Units by mouth daily.    Marland Kitchen ezetimibe (ZETIA) 10 MG tablet Take 10 mg by mouth daily.    Marland Kitchen gabapentin (NEURONTIN) 600 MG tablet Take 1 tablet (600 mg total) by mouth 3 (three) times daily. 90 tablet 11  . Glucosamine-Chondroit-Vit C-Mn (GLUCOSAMINE 1500 COMPLEX PO) Take 1 tablet by mouth daily.     . hydrOXYzine (ATARAX/VISTARIL) 50 MG tablet TAKE 1 - 2 TABLETS BY MOUTH AT NIGHT FOR INSOMNIA  2  . Lactobacillus (PROBIOTIC ACIDOPHILUS PO) Take 1 capsule by mouth daily.    Marland Kitchen losartan (COZAAR) 100 MG tablet Take 100 mg by mouth daily.    . Multiple Vitamins-Minerals (MULTIVITAMIN WITH MINERALS) tablet Take 1 tablet by  mouth daily.    Marland Kitchen oxymetazoline (AFRIN) 0.05 % nasal spray Place 1 spray into both nostrils 2 (two) times daily as needed for congestion.    . simvastatin (ZOCOR) 40 MG tablet Take 40 mg by mouth daily.    . travoprost, benzalkonium, (TRAVATAN) 0.004 % ophthalmic solution 1 drop at bedtime.    . vitamin A 10000 UNIT capsule Take 10,000 Units by mouth daily.    . vitamin E 400 UNIT capsule Take 400 Units by mouth  daily.    . Zinc Sulfate (ZINC 15 PO) Take 1 tablet by mouth daily.     No current facility-administered medications for this visit.     PAST MEDICAL HISTORY: Past Medical History:  Diagnosis Date  . Anxiety   . Arthritis   . Hyperlipemia   . Hypertension   . Lumbar spondylosis   . Prostate cancer ([place]) 2000  . Sleep apnea    v pap    PAST SURGICAL HISTORY: Past Surgical History:  Procedure Laterality Date  . ANKLE FRACTURE SURGERY    . APPENDECTOMY    . BACK SURGERY    . HERNIA REPAIR    . PROSTATE SURGERY    . TOTAL HIP ARTHROPLASTY Left 03/08/2015   Procedure: TOTAL HIP ARTHROPLASTY ANTERIOR APPROACH;  Surgeon: Frederik Pear, MD;  Location: [facility];  Service: Orthopedics;  Laterality: Left;    FAMILY HISTORY: Family History  Problem Relation Age of Onset  . Stroke Mother   . Prostate cancer Father   . Breast cancer Sister     SOCIAL HISTORY: Social History   Socioeconomic History  . Marital status: Married    Spouse name: Not on file  . Number of children: 2  . Years of education: college  . Highest education level: Bachelor's degree (e.g., BA, AB, BS)  Occupational History  . Occupation: Retired Immunologist)  Social Needs  . Financial resource strain: Not on file  . Food insecurity:    Worry: Not on file    Inability: Not on file  . Transportation needs:    Medical: Not on file    Non-medical: Not on file  Tobacco Use  . Smoking status: Never Smoker  . Smokeless tobacco: Never Used  Substance and Sexual Activity  . Alcohol use: Yes    Alcohol/week: 0.0 standard drinks    Comment: one beer per month  . Drug use: No  . Sexual activity: Not on file  Lifestyle  . Physical activity:    Days per week: Not on file    Minutes per session: Not on file  . Stress: Not on file  Relationships  . Social connections:    Talks on phone: Not on file    Gets together: Not on file    Attends religious service: Not on file    Active member of  club or organization: Not on file    Attends meetings of clubs or organizations: Not on file    Relationship status: Not on file  . Intimate partner violence:    Fear of current or ex partner: Not on file    Emotionally abused: Not on file    Physically abused: Not on file    Forced sexual activity: Not on file  Other Topics Concern  . Not on file  Social History Narrative   Lives at home with his wife, Katharine Look.   Right-handed.   No caffeine use.     PHYSICAL EXAM   There were no vitals  filed for this visit.  Not recorded      There is no height or weight on file to calculate BMI.  PHYSICAL EXAMNIATION:  Gen: NAD, conversant, well nourised, obese, well groomed                     Cardiovascular: Regular rate rhythm, no peripheral edema, warm, nontender. Eyes: Conjunctivae clear without exudates or hemorrhage Neck: Supple, no carotid bruits. Pulmonary: Clear to auscultation bilaterally   NEUROLOGICAL EXAM:  MENTAL STATUS: Speech:    Speech is normal; fluent and spontaneous with normal comprehension.  Cognition:     Orientation to time, place and person     Normal recent and remote memory     Normal Attention span and concentration     Normal Language, naming, repeating,spontaneous speech     Fund of knowledge   CRANIAL NERVES: CN II: Visual fields are full to confrontation. Pupils are round equal and briskly reactive to light. CN III, IV, VI: extraocular movement are normal. No ptosis. CN V: Facial sensation is intact to pinprick in all 3 divisions bilaterally. Corneal responses are intact.  CN VII: Face is symmetric with normal eye closure and smile. CN VIII: Hearing is normal to rubbing fingers CN IX, X: Palate elevates symmetrically. Phonation is normal. CN XI: Head turning and shoulder shrug are intact CN XII: Tongue is midline with normal movements and no atrophy.  MOTOR: There is no significant muscle weakness noted  REFLEXES: Reflexes are 2+ and  symmetric at the biceps, triceps, absent at knees, and ankles. Plantar responses are flexor.  SENSORY: Length dependent decreased to light touch, pinprick and vibratory sensation to ankle levels  COORDINATION: Rapid alternating movements and fine finger movements are intact. There is no dysmetria on finger-to-nose and heel-knee-shin.    GAIT/STANCE: Needs pushed up to get up from seated position, antalgic, cautious, Romberg is absent.   DIAGNOSTIC DATA (LABS, IMAGING, TESTING) - I reviewed patient records, labs, notes, testing and imaging myself where available.   ASSESSMENT AND PLAN  KELSIE KRAMP is a 77 y.o. male    Chronic low back pain, bilateral lower extremity pain,  Evidence of chronic bilateral lumbosacral radiculopathy on EMG nerve conduction study  MRI of the lumbar spine showed multilevel degenerative changes, chronic severe disc degeneration, throughout lower thoracic and lumbar spine, compared to 2014, there is progression of mild to moderate multifactorial spinal stenosis at L3 and 4, L2-3, L1-L2, associated moderate to severe left lateral recess stenosis, moderate to severe neuroforaminal stenosis L2-5, also evidence of lower thoracic spinal cord myelomalacia at T11-12  Keep lower dose of gabapentin to 600 mg 3 times a day to avoid the side effect  Continue moderate exercise , Marcial Pacas, M.D. Ph.D.  [facility] [address], [place], [place] [address] Ph: [phone] Fax: [phone]  CC:  Myer Peer, MD

## 2018-04-22 ENCOUNTER — Telehealth: Payer: Self-pay | Admitting: Neurology

## 2018-04-22 MED ORDER — GABAPENTIN 600 MG PO TABS: 900.0000 mg | ORAL_TABLET | Freq: Four times a day (QID) | ORAL | 0 refills | Status: DC

## 2018-04-22 NOTE — Addendum Note (Signed)
Addended by: Lester Funk A on: 04/22/2018 04:50 PM   Modules accepted: Orders

## 2018-04-22 NOTE — Telephone Encounter (Signed)
Chad Smith called me and asked if Dr. Krista Blue would put his dose of Gabapentin back to where it was previously. (600mg  tabs, 1.5 tabs, 4 times a day = 3600 mg a day).  I told him that I would forward the request to Dr Krista Blue.

## 2018-04-22 NOTE — Telephone Encounter (Signed)
I spoke to Dr. Krista Blue. She is agreeable to pt restarting the gabapentin 600mg  1.5 tablets QID.   I called pt. He asked that the RX be sent to [facility] [place]. Order placed.

## 2018-04-25 DIAGNOSIS — G4733 Obstructive sleep apnea (adult) (pediatric): Secondary | ICD-10-CM | POA: Diagnosis not present

## 2018-04-25 DIAGNOSIS — M353 Polymyalgia rheumatica: Secondary | ICD-10-CM | POA: Diagnosis not present

## 2018-04-25 DIAGNOSIS — M150 Primary generalized (osteo)arthritis: Secondary | ICD-10-CM | POA: Diagnosis not present

## 2018-04-26 DIAGNOSIS — G4733 Obstructive sleep apnea (adult) (pediatric): Secondary | ICD-10-CM | POA: Diagnosis not present

## 2018-04-26 DIAGNOSIS — M150 Primary generalized (osteo)arthritis: Secondary | ICD-10-CM | POA: Diagnosis not present

## 2018-04-26 DIAGNOSIS — M353 Polymyalgia rheumatica: Secondary | ICD-10-CM | POA: Diagnosis not present

## 2018-05-27 DIAGNOSIS — M353 Polymyalgia rheumatica: Secondary | ICD-10-CM | POA: Diagnosis not present

## 2018-05-27 DIAGNOSIS — M150 Primary generalized (osteo)arthritis: Secondary | ICD-10-CM | POA: Diagnosis not present

## 2018-05-27 DIAGNOSIS — G4733 Obstructive sleep apnea (adult) (pediatric): Secondary | ICD-10-CM | POA: Diagnosis not present

## 2018-06-27 DIAGNOSIS — G4733 Obstructive sleep apnea (adult) (pediatric): Secondary | ICD-10-CM | POA: Diagnosis not present

## 2018-06-27 DIAGNOSIS — M353 Polymyalgia rheumatica: Secondary | ICD-10-CM | POA: Diagnosis not present

## 2018-06-27 DIAGNOSIS — M150 Primary generalized (osteo)arthritis: Secondary | ICD-10-CM | POA: Diagnosis not present

## 2018-07-25 ENCOUNTER — Telehealth: Payer: Self-pay | Admitting: *Deleted

## 2018-07-25 MED ORDER — GABAPENTIN 600 MG PO TABS: 900.0000 mg | ORAL_TABLET | Freq: Four times a day (QID) | ORAL | 0 refills | Status: AC

## 2018-07-25 NOTE — Telephone Encounter (Signed)
Gabapentin rx. escribed to [facility] in response to faxed request from them/fim

## 2018-07-26 DIAGNOSIS — G4733 Obstructive sleep apnea (adult) (pediatric): Secondary | ICD-10-CM | POA: Diagnosis not present

## 2018-07-26 DIAGNOSIS — M353 Polymyalgia rheumatica: Secondary | ICD-10-CM | POA: Diagnosis not present

## 2018-07-26 DIAGNOSIS — M150 Primary generalized (osteo)arthritis: Secondary | ICD-10-CM | POA: Diagnosis not present

## 2018-07-30 DIAGNOSIS — H401132 Primary open-angle glaucoma, bilateral, moderate stage: Secondary | ICD-10-CM | POA: Diagnosis not present

## 2018-08-19 DIAGNOSIS — G4733 Obstructive sleep apnea (adult) (pediatric): Secondary | ICD-10-CM | POA: Diagnosis not present

## 2018-08-19 DIAGNOSIS — M353 Polymyalgia rheumatica: Secondary | ICD-10-CM | POA: Diagnosis not present

## 2018-08-19 DIAGNOSIS — M150 Primary generalized (osteo)arthritis: Secondary | ICD-10-CM | POA: Diagnosis not present

## 2018-08-26 DIAGNOSIS — M353 Polymyalgia rheumatica: Secondary | ICD-10-CM | POA: Diagnosis not present

## 2018-08-26 DIAGNOSIS — M150 Primary generalized (osteo)arthritis: Secondary | ICD-10-CM | POA: Diagnosis not present

## 2018-08-26 DIAGNOSIS — G4733 Obstructive sleep apnea (adult) (pediatric): Secondary | ICD-10-CM | POA: Diagnosis not present

## 2018-09-25 DIAGNOSIS — G4733 Obstructive sleep apnea (adult) (pediatric): Secondary | ICD-10-CM | POA: Diagnosis not present

## 2018-09-25 DIAGNOSIS — M353 Polymyalgia rheumatica: Secondary | ICD-10-CM | POA: Diagnosis not present

## 2018-09-25 DIAGNOSIS — M150 Primary generalized (osteo)arthritis: Secondary | ICD-10-CM | POA: Diagnosis not present

## 2018-10-15 DIAGNOSIS — M5136 Other intervertebral disc degeneration, lumbar region: Secondary | ICD-10-CM | POA: Diagnosis not present

## 2018-10-15 DIAGNOSIS — I10 Essential (primary) hypertension: Secondary | ICD-10-CM | POA: Diagnosis not present

## 2018-10-15 DIAGNOSIS — E782 Mixed hyperlipidemia: Secondary | ICD-10-CM | POA: Diagnosis not present

## 2018-10-15 DIAGNOSIS — M47816 Spondylosis without myelopathy or radiculopathy, lumbar region: Secondary | ICD-10-CM | POA: Diagnosis not present

## 2018-10-26 DIAGNOSIS — G4733 Obstructive sleep apnea (adult) (pediatric): Secondary | ICD-10-CM | POA: Diagnosis not present

## 2018-10-26 DIAGNOSIS — M150 Primary generalized (osteo)arthritis: Secondary | ICD-10-CM | POA: Diagnosis not present

## 2018-10-26 DIAGNOSIS — M353 Polymyalgia rheumatica: Secondary | ICD-10-CM | POA: Diagnosis not present

## 2018-10-31 DIAGNOSIS — H401132 Primary open-angle glaucoma, bilateral, moderate stage: Secondary | ICD-10-CM | POA: Diagnosis not present

## 2018-11-14 DIAGNOSIS — M150 Primary generalized (osteo)arthritis: Secondary | ICD-10-CM | POA: Diagnosis not present

## 2018-11-14 DIAGNOSIS — M353 Polymyalgia rheumatica: Secondary | ICD-10-CM | POA: Diagnosis not present

## 2018-11-14 DIAGNOSIS — G4733 Obstructive sleep apnea (adult) (pediatric): Secondary | ICD-10-CM | POA: Diagnosis not present

## 2018-11-25 DIAGNOSIS — M353 Polymyalgia rheumatica: Secondary | ICD-10-CM | POA: Diagnosis not present

## 2018-11-25 DIAGNOSIS — G4733 Obstructive sleep apnea (adult) (pediatric): Secondary | ICD-10-CM | POA: Diagnosis not present

## 2018-11-25 DIAGNOSIS — M150 Primary generalized (osteo)arthritis: Secondary | ICD-10-CM | POA: Diagnosis not present

## 2018-12-26 DIAGNOSIS — G4733 Obstructive sleep apnea (adult) (pediatric): Secondary | ICD-10-CM | POA: Diagnosis not present

## 2018-12-26 DIAGNOSIS — M150 Primary generalized (osteo)arthritis: Secondary | ICD-10-CM | POA: Diagnosis not present

## 2018-12-26 DIAGNOSIS — M353 Polymyalgia rheumatica: Secondary | ICD-10-CM | POA: Diagnosis not present

## 2019-01-01 DIAGNOSIS — R739 Hyperglycemia, unspecified: Secondary | ICD-10-CM | POA: Diagnosis not present

## 2019-01-01 DIAGNOSIS — E782 Mixed hyperlipidemia: Secondary | ICD-10-CM | POA: Diagnosis not present

## 2019-01-06 DIAGNOSIS — E782 Mixed hyperlipidemia: Secondary | ICD-10-CM | POA: Diagnosis not present

## 2019-01-06 DIAGNOSIS — M48061 Spinal stenosis, lumbar region without neurogenic claudication: Secondary | ICD-10-CM | POA: Diagnosis not present

## 2019-01-06 DIAGNOSIS — G4733 Obstructive sleep apnea (adult) (pediatric): Secondary | ICD-10-CM | POA: Diagnosis not present

## 2019-01-06 DIAGNOSIS — M5416 Radiculopathy, lumbar region: Secondary | ICD-10-CM | POA: Diagnosis not present

## 2019-01-06 DIAGNOSIS — Z0000 Encounter for general adult medical examination without abnormal findings: Secondary | ICD-10-CM | POA: Diagnosis not present

## 2019-01-26 DIAGNOSIS — M353 Polymyalgia rheumatica: Secondary | ICD-10-CM | POA: Diagnosis not present

## 2019-01-26 DIAGNOSIS — M150 Primary generalized (osteo)arthritis: Secondary | ICD-10-CM | POA: Diagnosis not present

## 2019-01-26 DIAGNOSIS — G4733 Obstructive sleep apnea (adult) (pediatric): Secondary | ICD-10-CM | POA: Diagnosis not present

## 2019-01-31 DIAGNOSIS — Z23 Encounter for immunization: Secondary | ICD-10-CM | POA: Diagnosis not present

## 2019-02-13 DIAGNOSIS — G4733 Obstructive sleep apnea (adult) (pediatric): Secondary | ICD-10-CM | POA: Diagnosis not present

## 2019-02-13 DIAGNOSIS — M353 Polymyalgia rheumatica: Secondary | ICD-10-CM | POA: Diagnosis not present

## 2019-02-13 DIAGNOSIS — M150 Primary generalized (osteo)arthritis: Secondary | ICD-10-CM | POA: Diagnosis not present

## 2019-02-19 DIAGNOSIS — R079 Chest pain, unspecified: Secondary | ICD-10-CM | POA: Diagnosis not present

## 2019-02-19 DIAGNOSIS — R509 Fever, unspecified: Secondary | ICD-10-CM | POA: Diagnosis not present

## 2019-02-19 DIAGNOSIS — I213 ST elevation (STEMI) myocardial infarction of unspecified site: Secondary | ICD-10-CM | POA: Diagnosis not present

## 2019-02-19 DIAGNOSIS — I10 Essential (primary) hypertension: Secondary | ICD-10-CM | POA: Diagnosis not present

## 2019-02-19 DIAGNOSIS — I2119 ST elevation (STEMI) myocardial infarction involving other coronary artery of inferior wall: Secondary | ICD-10-CM | POA: Diagnosis not present

## 2019-02-19 DIAGNOSIS — R9431 Abnormal electrocardiogram [ECG] [EKG]: Secondary | ICD-10-CM | POA: Diagnosis not present

## 2019-02-19 DIAGNOSIS — E7849 Other hyperlipidemia: Secondary | ICD-10-CM | POA: Diagnosis not present

## 2019-02-19 DIAGNOSIS — Z955 Presence of coronary angioplasty implant and graft: Secondary | ICD-10-CM | POA: Diagnosis not present

## 2019-02-19 DIAGNOSIS — I509 Heart failure, unspecified: Secondary | ICD-10-CM | POA: Diagnosis not present

## 2019-02-19 DIAGNOSIS — E785 Hyperlipidemia, unspecified: Secondary | ICD-10-CM | POA: Diagnosis not present

## 2019-02-19 DIAGNOSIS — I499 Cardiac arrhythmia, unspecified: Secondary | ICD-10-CM | POA: Diagnosis not present

## 2019-02-19 DIAGNOSIS — I2510 Atherosclerotic heart disease of native coronary artery without angina pectoris: Secondary | ICD-10-CM | POA: Diagnosis not present

## 2019-02-19 DIAGNOSIS — I498 Other specified cardiac arrhythmias: Secondary | ICD-10-CM | POA: Diagnosis not present

## 2019-02-19 DIAGNOSIS — R5381 Other malaise: Secondary | ICD-10-CM | POA: Diagnosis not present

## 2019-02-19 DIAGNOSIS — I2111 ST elevation (STEMI) myocardial infarction involving right coronary artery: Secondary | ICD-10-CM | POA: Diagnosis not present

## 2019-02-27 DIAGNOSIS — I252 Old myocardial infarction: Secondary | ICD-10-CM | POA: Diagnosis not present

## 2019-02-27 DIAGNOSIS — E782 Mixed hyperlipidemia: Secondary | ICD-10-CM | POA: Diagnosis not present

## 2019-02-27 DIAGNOSIS — I2510 Atherosclerotic heart disease of native coronary artery without angina pectoris: Secondary | ICD-10-CM | POA: Diagnosis not present

## 2019-03-04 DIAGNOSIS — G4733 Obstructive sleep apnea (adult) (pediatric): Secondary | ICD-10-CM | POA: Diagnosis not present

## 2019-03-04 DIAGNOSIS — E782 Mixed hyperlipidemia: Secondary | ICD-10-CM | POA: Diagnosis not present

## 2019-03-04 DIAGNOSIS — Z7982 Long term (current) use of aspirin: Secondary | ICD-10-CM | POA: Diagnosis not present

## 2019-03-04 DIAGNOSIS — I2510 Atherosclerotic heart disease of native coronary artery without angina pectoris: Secondary | ICD-10-CM | POA: Diagnosis not present

## 2019-03-04 DIAGNOSIS — I10 Essential (primary) hypertension: Secondary | ICD-10-CM | POA: Diagnosis not present

## 2019-05-07 DIAGNOSIS — G4733 Obstructive sleep apnea (adult) (pediatric): Secondary | ICD-10-CM | POA: Diagnosis not present

## 2019-05-07 DIAGNOSIS — M150 Primary generalized (osteo)arthritis: Secondary | ICD-10-CM | POA: Diagnosis not present

## 2019-05-07 DIAGNOSIS — M353 Polymyalgia rheumatica: Secondary | ICD-10-CM | POA: Diagnosis not present

## 2019-06-05 DIAGNOSIS — G4733 Obstructive sleep apnea (adult) (pediatric): Secondary | ICD-10-CM | POA: Diagnosis not present

## 2019-06-05 DIAGNOSIS — E782 Mixed hyperlipidemia: Secondary | ICD-10-CM | POA: Diagnosis not present

## 2019-06-05 DIAGNOSIS — I10 Essential (primary) hypertension: Secondary | ICD-10-CM | POA: Diagnosis not present

## 2019-06-05 DIAGNOSIS — Z7982 Long term (current) use of aspirin: Secondary | ICD-10-CM | POA: Diagnosis not present

## 2019-06-05 DIAGNOSIS — I2510 Atherosclerotic heart disease of native coronary artery without angina pectoris: Secondary | ICD-10-CM | POA: Diagnosis not present

## 2019-07-13 DIAGNOSIS — E782 Mixed hyperlipidemia: Secondary | ICD-10-CM | POA: Diagnosis not present

## 2019-07-13 DIAGNOSIS — I10 Essential (primary) hypertension: Secondary | ICD-10-CM | POA: Diagnosis not present

## 2019-07-13 DIAGNOSIS — I2510 Atherosclerotic heart disease of native coronary artery without angina pectoris: Secondary | ICD-10-CM | POA: Diagnosis not present

## 2019-07-23 DIAGNOSIS — G4733 Obstructive sleep apnea (adult) (pediatric): Secondary | ICD-10-CM | POA: Diagnosis not present

## 2019-07-23 DIAGNOSIS — M353 Polymyalgia rheumatica: Secondary | ICD-10-CM | POA: Diagnosis not present

## 2019-07-23 DIAGNOSIS — M150 Primary generalized (osteo)arthritis: Secondary | ICD-10-CM | POA: Diagnosis not present

## 2019-07-30 DIAGNOSIS — H401132 Primary open-angle glaucoma, bilateral, moderate stage: Secondary | ICD-10-CM | POA: Diagnosis not present

## 2019-08-08 DIAGNOSIS — G4733 Obstructive sleep apnea (adult) (pediatric): Secondary | ICD-10-CM | POA: Diagnosis not present

## 2019-08-08 DIAGNOSIS — M353 Polymyalgia rheumatica: Secondary | ICD-10-CM | POA: Diagnosis not present

## 2019-08-08 DIAGNOSIS — M150 Primary generalized (osteo)arthritis: Secondary | ICD-10-CM | POA: Diagnosis not present

## 2019-08-13 DIAGNOSIS — E782 Mixed hyperlipidemia: Secondary | ICD-10-CM | POA: Diagnosis not present

## 2019-08-13 DIAGNOSIS — I2510 Atherosclerotic heart disease of native coronary artery without angina pectoris: Secondary | ICD-10-CM | POA: Diagnosis not present

## 2019-09-04 DIAGNOSIS — G4733 Obstructive sleep apnea (adult) (pediatric): Secondary | ICD-10-CM | POA: Diagnosis not present

## 2019-09-04 DIAGNOSIS — I10 Essential (primary) hypertension: Secondary | ICD-10-CM | POA: Diagnosis not present

## 2019-09-04 DIAGNOSIS — Z7982 Long term (current) use of aspirin: Secondary | ICD-10-CM | POA: Diagnosis not present

## 2019-09-04 DIAGNOSIS — E782 Mixed hyperlipidemia: Secondary | ICD-10-CM | POA: Diagnosis not present

## 2019-09-04 DIAGNOSIS — I2510 Atherosclerotic heart disease of native coronary artery without angina pectoris: Secondary | ICD-10-CM | POA: Diagnosis not present

## 2019-10-30 DIAGNOSIS — G4733 Obstructive sleep apnea (adult) (pediatric): Secondary | ICD-10-CM | POA: Diagnosis not present

## 2019-10-30 DIAGNOSIS — M150 Primary generalized (osteo)arthritis: Secondary | ICD-10-CM | POA: Diagnosis not present

## 2019-10-30 DIAGNOSIS — M353 Polymyalgia rheumatica: Secondary | ICD-10-CM | POA: Diagnosis not present

## 2019-12-26 DIAGNOSIS — Z79899 Other long term (current) drug therapy: Secondary | ICD-10-CM | POA: Diagnosis not present

## 2019-12-26 DIAGNOSIS — E782 Mixed hyperlipidemia: Secondary | ICD-10-CM | POA: Diagnosis not present

## 2019-12-26 DIAGNOSIS — Z0000 Encounter for general adult medical examination without abnormal findings: Secondary | ICD-10-CM | POA: Diagnosis not present

## 2020-01-13 DIAGNOSIS — Z0000 Encounter for general adult medical examination without abnormal findings: Secondary | ICD-10-CM | POA: Diagnosis not present

## 2020-01-13 DIAGNOSIS — I2510 Atherosclerotic heart disease of native coronary artery without angina pectoris: Secondary | ICD-10-CM | POA: Diagnosis not present

## 2020-01-13 DIAGNOSIS — I10 Essential (primary) hypertension: Secondary | ICD-10-CM | POA: Diagnosis not present

## 2020-01-13 DIAGNOSIS — E782 Mixed hyperlipidemia: Secondary | ICD-10-CM | POA: Diagnosis not present

## 2020-03-05 DIAGNOSIS — I2510 Atherosclerotic heart disease of native coronary artery without angina pectoris: Secondary | ICD-10-CM | POA: Diagnosis not present

## 2020-03-05 DIAGNOSIS — G4733 Obstructive sleep apnea (adult) (pediatric): Secondary | ICD-10-CM | POA: Diagnosis not present

## 2020-03-05 DIAGNOSIS — I10 Essential (primary) hypertension: Secondary | ICD-10-CM | POA: Diagnosis not present

## 2020-03-05 DIAGNOSIS — E782 Mixed hyperlipidemia: Secondary | ICD-10-CM | POA: Diagnosis not present

## 2020-03-05 DIAGNOSIS — Z7982 Long term (current) use of aspirin: Secondary | ICD-10-CM | POA: Diagnosis not present

## 2020-04-07 DIAGNOSIS — Z23 Encounter for immunization: Secondary | ICD-10-CM | POA: Diagnosis not present

## 2020-04-28 DIAGNOSIS — M353 Polymyalgia rheumatica: Secondary | ICD-10-CM | POA: Diagnosis not present

## 2020-04-28 DIAGNOSIS — G4733 Obstructive sleep apnea (adult) (pediatric): Secondary | ICD-10-CM | POA: Diagnosis not present

## 2020-04-28 DIAGNOSIS — M150 Primary generalized (osteo)arthritis: Secondary | ICD-10-CM | POA: Diagnosis not present

## 2020-07-28 DIAGNOSIS — M353 Polymyalgia rheumatica: Secondary | ICD-10-CM | POA: Diagnosis not present

## 2020-07-28 DIAGNOSIS — G4733 Obstructive sleep apnea (adult) (pediatric): Secondary | ICD-10-CM | POA: Diagnosis not present

## 2020-07-28 DIAGNOSIS — M150 Primary generalized (osteo)arthritis: Secondary | ICD-10-CM | POA: Diagnosis not present

## 2020-08-04 DIAGNOSIS — H401132 Primary open-angle glaucoma, bilateral, moderate stage: Secondary | ICD-10-CM | POA: Diagnosis not present

## 2020-08-10 DIAGNOSIS — D225 Melanocytic nevi of trunk: Secondary | ICD-10-CM | POA: Diagnosis not present

## 2020-08-10 DIAGNOSIS — L814 Other melanin hyperpigmentation: Secondary | ICD-10-CM | POA: Diagnosis not present

## 2020-08-10 DIAGNOSIS — L578 Other skin changes due to chronic exposure to nonionizing radiation: Secondary | ICD-10-CM | POA: Diagnosis not present

## 2020-08-10 DIAGNOSIS — D1801 Hemangioma of skin and subcutaneous tissue: Secondary | ICD-10-CM | POA: Diagnosis not present

## 2020-08-10 DIAGNOSIS — D485 Neoplasm of uncertain behavior of skin: Secondary | ICD-10-CM | POA: Diagnosis not present

## 2020-08-11 DIAGNOSIS — S0990XA Unspecified injury of head, initial encounter: Secondary | ICD-10-CM | POA: Diagnosis not present

## 2020-08-11 DIAGNOSIS — W208XXA Other cause of strike by thrown, projected or falling object, initial encounter: Secondary | ICD-10-CM | POA: Diagnosis not present

## 2020-08-11 DIAGNOSIS — Y998 Other external cause status: Secondary | ICD-10-CM | POA: Diagnosis not present

## 2020-09-03 DIAGNOSIS — G4733 Obstructive sleep apnea (adult) (pediatric): Secondary | ICD-10-CM | POA: Diagnosis not present

## 2020-09-03 DIAGNOSIS — Z7982 Long term (current) use of aspirin: Secondary | ICD-10-CM | POA: Diagnosis not present

## 2020-09-03 DIAGNOSIS — E782 Mixed hyperlipidemia: Secondary | ICD-10-CM | POA: Diagnosis not present

## 2020-09-03 DIAGNOSIS — Z9989 Dependence on other enabling machines and devices: Secondary | ICD-10-CM | POA: Diagnosis not present

## 2020-09-03 DIAGNOSIS — I10 Essential (primary) hypertension: Secondary | ICD-10-CM | POA: Diagnosis not present

## 2020-09-03 DIAGNOSIS — I2510 Atherosclerotic heart disease of native coronary artery without angina pectoris: Secondary | ICD-10-CM | POA: Diagnosis not present

## 2020-10-07 DIAGNOSIS — H6001 Abscess of right external ear: Secondary | ICD-10-CM | POA: Diagnosis not present

## 2020-10-07 DIAGNOSIS — M5136 Other intervertebral disc degeneration, lumbar region: Secondary | ICD-10-CM | POA: Diagnosis not present

## 2020-10-19 DIAGNOSIS — M150 Primary generalized (osteo)arthritis: Secondary | ICD-10-CM | POA: Diagnosis not present

## 2020-10-19 DIAGNOSIS — G4733 Obstructive sleep apnea (adult) (pediatric): Secondary | ICD-10-CM | POA: Diagnosis not present

## 2020-10-19 DIAGNOSIS — M353 Polymyalgia rheumatica: Secondary | ICD-10-CM | POA: Diagnosis not present

## 2021-01-11 DIAGNOSIS — Z79899 Other long term (current) drug therapy: Secondary | ICD-10-CM | POA: Diagnosis not present

## 2021-01-11 DIAGNOSIS — E782 Mixed hyperlipidemia: Secondary | ICD-10-CM | POA: Diagnosis not present

## 2021-01-27 DIAGNOSIS — M5136 Other intervertebral disc degeneration, lumbar region: Secondary | ICD-10-CM | POA: Diagnosis not present

## 2021-01-27 DIAGNOSIS — G4733 Obstructive sleep apnea (adult) (pediatric): Secondary | ICD-10-CM | POA: Diagnosis not present

## 2021-01-27 DIAGNOSIS — Z23 Encounter for immunization: Secondary | ICD-10-CM | POA: Diagnosis not present

## 2021-01-27 DIAGNOSIS — Z0000 Encounter for general adult medical examination without abnormal findings: Secondary | ICD-10-CM | POA: Diagnosis not present

## 2021-01-27 DIAGNOSIS — I10 Essential (primary) hypertension: Secondary | ICD-10-CM | POA: Diagnosis not present

## 2021-01-27 DIAGNOSIS — E782 Mixed hyperlipidemia: Secondary | ICD-10-CM | POA: Diagnosis not present

## 2021-01-28 DIAGNOSIS — M150 Primary generalized (osteo)arthritis: Secondary | ICD-10-CM | POA: Diagnosis not present

## 2021-01-28 DIAGNOSIS — M353 Polymyalgia rheumatica: Secondary | ICD-10-CM | POA: Diagnosis not present

## 2021-01-28 DIAGNOSIS — G4733 Obstructive sleep apnea (adult) (pediatric): Secondary | ICD-10-CM | POA: Diagnosis not present

## 2021-02-01 DIAGNOSIS — H401132 Primary open-angle glaucoma, bilateral, moderate stage: Secondary | ICD-10-CM | POA: Diagnosis not present

## 2021-02-01 DIAGNOSIS — H353131 Nonexudative age-related macular degeneration, bilateral, early dry stage: Secondary | ICD-10-CM | POA: Diagnosis not present

## 2021-02-24 DIAGNOSIS — D3132 Benign neoplasm of left choroid: Secondary | ICD-10-CM | POA: Diagnosis not present

## 2021-02-24 DIAGNOSIS — H353112 Nonexudative age-related macular degeneration, right eye, intermediate dry stage: Secondary | ICD-10-CM | POA: Diagnosis not present

## 2021-02-24 DIAGNOSIS — H353220 Exudative age-related macular degeneration, left eye, stage unspecified: Secondary | ICD-10-CM | POA: Diagnosis not present

## 2021-03-08 ENCOUNTER — Other Ambulatory Visit (HOSPITAL_COMMUNITY): Payer: Self-pay | Admitting: Urology

## 2021-03-08 DIAGNOSIS — R9721 Rising PSA following treatment for malignant neoplasm of prostate: Secondary | ICD-10-CM

## 2021-03-10 DIAGNOSIS — I2510 Atherosclerotic heart disease of native coronary artery without angina pectoris: Secondary | ICD-10-CM | POA: Diagnosis not present

## 2021-03-10 DIAGNOSIS — G4733 Obstructive sleep apnea (adult) (pediatric): Secondary | ICD-10-CM | POA: Diagnosis not present

## 2021-03-10 DIAGNOSIS — I10 Essential (primary) hypertension: Secondary | ICD-10-CM | POA: Diagnosis not present

## 2021-03-10 DIAGNOSIS — Z9989 Dependence on other enabling machines and devices: Secondary | ICD-10-CM | POA: Diagnosis not present

## 2021-03-10 DIAGNOSIS — E782 Mixed hyperlipidemia: Secondary | ICD-10-CM | POA: Diagnosis not present

## 2021-03-10 DIAGNOSIS — Z7982 Long term (current) use of aspirin: Secondary | ICD-10-CM | POA: Diagnosis not present

## 2021-03-18 ENCOUNTER — Encounter (HOSPITAL_COMMUNITY)
Admission: RE | Admit: 2021-03-18 | Discharge: 2021-03-18 | Disposition: A | Discharge status: Home / Self Care | Payer: Medicare Other | Source: Ambulatory Visit | Attending: Urology | Admitting: Urology

## 2021-03-18 ENCOUNTER — Other Ambulatory Visit: Payer: Self-pay

## 2021-03-18 DIAGNOSIS — J9811 Atelectasis: Secondary | ICD-10-CM | POA: Diagnosis not present

## 2021-03-18 DIAGNOSIS — R9721 Rising PSA following treatment for malignant neoplasm of prostate: Secondary | ICD-10-CM | POA: Diagnosis present

## 2021-03-18 DIAGNOSIS — I2510 Atherosclerotic heart disease of native coronary artery without angina pectoris: Secondary | ICD-10-CM | POA: Diagnosis not present

## 2021-03-18 DIAGNOSIS — N281 Cyst of kidney, acquired: Secondary | ICD-10-CM | POA: Diagnosis not present

## 2021-03-18 MED ORDER — PIFLIFOLASTAT F 18 (PYLARIFY) INJECTION
9.0000 | Freq: Once | INTRAVENOUS | Status: AC
  Administered 2021-03-18: 8.3 via INTRAVENOUS

## 2021-04-18 ENCOUNTER — Encounter (HOSPITAL_COMMUNITY): Payer: Self-pay | Admitting: Radiology

## 2021-04-28 DIAGNOSIS — G4733 Obstructive sleep apnea (adult) (pediatric): Secondary | ICD-10-CM | POA: Diagnosis not present

## 2021-04-28 DIAGNOSIS — K769 Liver disease, unspecified: Secondary | ICD-10-CM | POA: Diagnosis not present

## 2021-04-28 DIAGNOSIS — M353 Polymyalgia rheumatica: Secondary | ICD-10-CM | POA: Diagnosis not present

## 2021-04-28 DIAGNOSIS — R911 Solitary pulmonary nodule: Secondary | ICD-10-CM | POA: Diagnosis not present

## 2021-04-28 DIAGNOSIS — M150 Primary generalized (osteo)arthritis: Secondary | ICD-10-CM | POA: Diagnosis not present

## 2021-05-02 ENCOUNTER — Other Ambulatory Visit (HOSPITAL_COMMUNITY): Payer: Self-pay | Admitting: Family Medicine

## 2021-05-02 ENCOUNTER — Other Ambulatory Visit: Payer: Self-pay | Admitting: Family Medicine

## 2021-05-02 DIAGNOSIS — K769 Liver disease, unspecified: Secondary | ICD-10-CM

## 2021-06-02 ENCOUNTER — Ambulatory Visit (HOSPITAL_COMMUNITY)
Admission: RE | Admit: 2021-06-02 | Discharge: 2021-06-02 | Disposition: A | Discharge status: Home / Self Care | Payer: Medicare Other | Source: Ambulatory Visit | Attending: Family Medicine | Admitting: Family Medicine

## 2021-06-02 ENCOUNTER — Other Ambulatory Visit: Payer: Self-pay

## 2021-06-02 DIAGNOSIS — K769 Liver disease, unspecified: Secondary | ICD-10-CM | POA: Diagnosis not present

## 2021-06-02 DIAGNOSIS — N281 Cyst of kidney, acquired: Secondary | ICD-10-CM | POA: Diagnosis not present

## 2021-06-02 MED ORDER — GADOBUTROL 1 MMOL/ML IV SOLN
10.0000 mL | Freq: Once | INTRAVENOUS | Status: AC | PRN
  Administered 2021-06-02: 10 mL via INTRAVENOUS

## 2021-06-09 DIAGNOSIS — D3132 Benign neoplasm of left choroid: Secondary | ICD-10-CM | POA: Diagnosis not present

## 2021-06-09 DIAGNOSIS — H353112 Nonexudative age-related macular degeneration, right eye, intermediate dry stage: Secondary | ICD-10-CM | POA: Diagnosis not present

## 2021-06-09 DIAGNOSIS — H353222 Exudative age-related macular degeneration, left eye, with inactive choroidal neovascularization: Secondary | ICD-10-CM | POA: Diagnosis not present

## 2021-07-09 DIAGNOSIS — M25511 Pain in right shoulder: Secondary | ICD-10-CM | POA: Diagnosis not present

## 2021-07-13 ENCOUNTER — Other Ambulatory Visit (HOSPITAL_COMMUNITY): Payer: Self-pay | Admitting: Orthopedic Surgery

## 2021-07-13 ENCOUNTER — Other Ambulatory Visit: Payer: Self-pay | Admitting: Orthopedic Surgery

## 2021-07-13 DIAGNOSIS — M25511 Pain in right shoulder: Secondary | ICD-10-CM

## 2021-07-20 DIAGNOSIS — H401132 Primary open-angle glaucoma, bilateral, moderate stage: Secondary | ICD-10-CM | POA: Diagnosis not present

## 2021-07-25 ENCOUNTER — Other Ambulatory Visit: Payer: Self-pay

## 2021-07-25 ENCOUNTER — Ambulatory Visit (HOSPITAL_COMMUNITY)
Admission: RE | Admit: 2021-07-25 | Discharge: 2021-07-25 | Disposition: A | Discharge status: Home / Self Care | Payer: Medicare Other | Source: Ambulatory Visit | Attending: Orthopedic Surgery | Admitting: Orthopedic Surgery

## 2021-07-25 DIAGNOSIS — M25511 Pain in right shoulder: Secondary | ICD-10-CM | POA: Diagnosis not present

## 2021-07-28 DIAGNOSIS — G4733 Obstructive sleep apnea (adult) (pediatric): Secondary | ICD-10-CM | POA: Diagnosis not present

## 2021-07-28 DIAGNOSIS — M353 Polymyalgia rheumatica: Secondary | ICD-10-CM | POA: Diagnosis not present

## 2021-07-28 DIAGNOSIS — M150 Primary generalized (osteo)arthritis: Secondary | ICD-10-CM | POA: Diagnosis not present

## 2021-08-03 DIAGNOSIS — M75121 Complete rotator cuff tear or rupture of right shoulder, not specified as traumatic: Secondary | ICD-10-CM | POA: Diagnosis not present

## 2021-08-05 DIAGNOSIS — L570 Actinic keratosis: Secondary | ICD-10-CM | POA: Diagnosis not present

## 2021-08-05 DIAGNOSIS — D2239 Melanocytic nevi of other parts of face: Secondary | ICD-10-CM | POA: Diagnosis not present

## 2021-08-05 DIAGNOSIS — L821 Other seborrheic keratosis: Secondary | ICD-10-CM | POA: Diagnosis not present

## 2021-08-05 DIAGNOSIS — D485 Neoplasm of uncertain behavior of skin: Secondary | ICD-10-CM | POA: Diagnosis not present

## 2021-08-05 DIAGNOSIS — D225 Melanocytic nevi of trunk: Secondary | ICD-10-CM | POA: Diagnosis not present

## 2021-08-05 DIAGNOSIS — L853 Xerosis cutis: Secondary | ICD-10-CM | POA: Diagnosis not present

## 2021-08-10 ENCOUNTER — Other Ambulatory Visit: Payer: Self-pay | Admitting: Orthopedic Surgery

## 2021-08-15 DIAGNOSIS — C44612 Basal cell carcinoma of skin of right upper limb, including shoulder: Secondary | ICD-10-CM | POA: Diagnosis not present

## 2021-08-16 ENCOUNTER — Other Ambulatory Visit: Payer: Self-pay | Admitting: Orthopedic Surgery

## 2021-08-17 ENCOUNTER — Other Ambulatory Visit: Payer: Self-pay | Admitting: Orthopedic Surgery

## 2021-08-17 DIAGNOSIS — M25511 Pain in right shoulder: Secondary | ICD-10-CM

## 2021-08-18 DIAGNOSIS — Z01818 Encounter for other preprocedural examination: Secondary | ICD-10-CM | POA: Diagnosis not present

## 2021-08-18 DIAGNOSIS — M75101 Unspecified rotator cuff tear or rupture of right shoulder, not specified as traumatic: Secondary | ICD-10-CM | POA: Diagnosis not present

## 2021-08-18 DIAGNOSIS — I2510 Atherosclerotic heart disease of native coronary artery without angina pectoris: Secondary | ICD-10-CM | POA: Diagnosis not present

## 2021-08-18 DIAGNOSIS — Z01810 Encounter for preprocedural cardiovascular examination: Secondary | ICD-10-CM | POA: Diagnosis not present

## 2021-08-18 DIAGNOSIS — M12811 Other specific arthropathies, not elsewhere classified, right shoulder: Secondary | ICD-10-CM | POA: Diagnosis not present

## 2021-08-24 ENCOUNTER — Ambulatory Visit
Admission: RE | Admit: 2021-08-24 | Discharge: 2021-08-24 | Disposition: A | Discharge status: Home / Self Care | Payer: Medicare Other | Source: Ambulatory Visit | Attending: Orthopedic Surgery | Admitting: Orthopedic Surgery

## 2021-08-24 ENCOUNTER — Other Ambulatory Visit (HOSPITAL_COMMUNITY): Payer: Medicare Other

## 2021-08-24 DIAGNOSIS — M25511 Pain in right shoulder: Secondary | ICD-10-CM | POA: Diagnosis not present

## 2021-08-24 NOTE — Progress Notes (Addendum)
COVID Vaccine Completed: yes x 4 ?Date COVID Vaccine completed: ?Has received booster: ?COVID vaccine manufacturer: [place]  ? ?Date of COVID positive in last 90 days: no ? ?PCP - Bertram Millard, MD ?Cardiologist - Mathis Bud, MD ? ?Patient states he has seen PCP and cardiologist and they are aware of surgery ? ?Chest x-ray - n/a ?EKG -08/18/21 on chart ?Stress Test - 02/20/19 CE ?ECHO - n/a ?Cardiac Cath - 02/19/19 CE ?Pacemaker/ICD device last checked: n/a ?Spinal Cord Stimulator: n/a ? ?Bowel Prep - no ? ?Sleep Study - yes, positive ?CPAP - yes every night ? ?Fasting Blood Sugar - n/a ?Checks Blood Sugar _____ times a day ? ?Blood Thinner Instructions: ?Aspirin Instructions: ASA 81, hold 7 days ?Last Dose: ? ?Activity level: Can go up a flight of stairs and perform activities of daily living without stopping and without symptoms of chest pain or shortness of breath. ?   ? ?Anesthesia review: HTN, OSA, CAD, Stents ? ?Patient denies shortness of breath, fever, cough and chest pain at PAT appointment ? ? ?Patient verbalized understanding of instructions that were given to them at the PAT appointment. Patient was also instructed that they will need to review over the PAT instructions again at home before surgery.  ?

## 2021-08-24 NOTE — Patient Instructions (Addendum)
DUE TO COVID-19 ONLY ONE VISITOR  (aged 81 and older)  IS ALLOWED TO COME WITH YOU AND STAY IN THE WAITING ROOM ONLY DURING PRE OP AND PROCEDURE.   ?**NO VISITORS ARE ALLOWED IN THE SHORT STAY AREA OR RECOVERY ROOM!!** ? ? Your procedure is scheduled on: 09/01/21 ? ? Report to [facility] Main Entrance ? ?  Report to admitting at 10:15 AM ? ? Call this number if you have problems the morning of surgery [phone] ? ? Do not eat food :After Midnight. ? ? After Midnight you may have the following liquids until 10:00 AM DAY OF SURGERY ? ?Water ?Black Coffee (sugar ok, NO MILK/CREAM OR CREAMERS)  ?Tea (sugar ok, NO MILK/CREAM OR CREAMERS) regular and decaf                             ?Plain Jell-O (NO RED)                                           ?Fruit ices (not with fruit pulp, NO RED)                                     ?Popsicles (NO RED)                                                                  ?Juice: apple, WHITE grape, WHITE cranberry ?Sports drinks like Gatorade (NO RED) ?Clear broth(vegetable,chicken,beef  ?  ?The day of surgery:  ?Drink ONE (1) Pre-Surgery Clear Ensure at 10:00 AM the morning of surgery. Drink in one sitting. Do not sip.  ?This drink was given to you during your hospital  ?pre-op appointment visit. ?Nothing else to drink after completing the  ?Pre-Surgery Clear Ensure. ?  ?       If you have questions, please contact your surgeon?s office. ? ? ?FOLLOW BOWEL PREP AND ANY ADDITIONAL PRE OP INSTRUCTIONS YOU RECEIVED FROM YOUR SURGEON'S OFFICE!!! ?  ?  ?Oral Hygiene is also important to reduce your risk of infection.                                    ?Remember - BRUSH YOUR TEETH THE MORNING OF SURGERY WITH YOUR REGULAR TOOTHPASTE ? ? Take these medicines the morning of surgery with A SIP OF WATER: Zetia, Gabapentin, Metoprolol.  ? ?Bring CPAP mask and tubing day of surgery. ?                  ?           You may not have any metal on your body including jewelry, and body  piercing ? ?           Do not wear lotions, powders, cologne, or deodorant ? ?            Men may shave face and neck. ? ? Do not bring valuables to the hospital. [facility] IS NOT ?            RESPONSIBLE   FOR VALUABLES. ? ? Patients discharged on the day of surgery will not be allowed to drive home.  Someone NEEDS to stay with you for the first 24 hours after anesthesia. ? ?            Please read over the following fact sheets you were given: [facility] [place] [phone]- Apolonio Schneiders ? ?   [place] - Preparing for Surgery ?Before surgery, you can play an important role.  Because skin is not sterile, your skin needs to be as free of germs as possible.  You can reduce the number of germs on your skin by washing with CHG (chlorahexidine gluconate) soap before surgery.  CHG is an antiseptic cleaner which kills germs and bonds with the skin to continue killing germs even after washing. ?Please DO NOT use if you have an allergy to CHG or antibacterial soaps.  If your skin becomes reddened/irritated stop using the CHG and inform your nurse when you arrive at Short Stay. ?Do not shave (including legs and underarms) for at least 48 hours prior to the first CHG shower.  You may shave your face/neck. ? ?Please follow these instructions carefully: ? 1.  Shower with CHG Soap the night before surgery and the  morning of surgery. ? 2.  If you choose to wash your hair, wash your hair first as usual with your normal  shampoo. ? 3.  After you shampoo, rinse your hair and body thoroughly to remove the shampoo.                            ? 4.  Use CHG as you would any other liquid soap.  You can apply chg directly to the skin and wash.  Gently with a scrungie or clean washcloth. ? 5.  Apply the CHG Soap to your body ONLY FROM THE NECK DOWN.   Do   not use on face/ open      ?                     Wound or open sores. Avoid contact with eyes, ears mouth and   genitals (private parts).   ?                     Production manager,  Genitals (private parts) with your normal soap. ?            6.  Wash thoroughly, paying special attention to the area where your    surgery  will be performed. ? 7.  Thoroughly rinse your body with warm water from the neck down. ? 8.  DO NOT shower/wash with your normal soap after using and rinsing off the CHG Soap. ?               9.  Pat yourself dry with a clean towel. ?           10.  Wear clean pajamas. ?           11.  Place clean sheets on your bed the night of your first shower and do not  sleep with pets. ?Day of Surgery : ?Do not apply any lotions/deodorants the morning of surgery.  Please wear clean clothes to the hospital/surgery center. ? ?FAILURE TO FOLLOW THESE  INSTRUCTIONS MAY RESULT IN THE CANCELLATION OF YOUR SURGERY ? ?PATIENT SIGNATURE_________________________________ ? ?NURSE SIGNATURE__________________________________ ? ?________________________________________________________________________  ? ?Incentive Spirometer ? ?An incentive spirometer is a tool that can help keep your lungs clear and active. This tool measures how well you are filling your lungs with each breath. Taking long deep breaths may help reverse or decrease the chance of developing breathing (pulmonary) problems (especially infection) following: ?A long period of time when you are unable to move or be active. ?BEFORE THE PROCEDURE  ?If the spirometer includes an indicator to show your best effort, your nurse or respiratory therapist will set it to a desired goal. ?If possible, sit up straight or lean slightly forward. Try not to slouch. ?Hold the incentive spirometer in an upright position. ?INSTRUCTIONS FOR USE  ?Sit on the edge of your bed if possible, or sit up as far as you can in bed or on a chair. ?Hold the incentive spirometer in an upright position. ?Breathe out normally. ?Place the mouthpiece in your mouth and seal your lips tightly around it. ?Breathe in slowly and as deeply as  possible, raising the piston or the ball toward the top of the column. ?Hold your breath for 3-5 seconds or for as long as possible. Allow the piston or ball to fall to the bottom of the column. ?Remove the mouthpiece from your mouth and breathe out normally. ?Rest for a few seconds and repeat Steps 1 through 7 at least 10 times every 1-2 hours when you are awake. Take your time and take a few normal breaths between deep breaths. ?The spirometer may include an indicator to show your best effort. Use the indicator as a goal to work toward during each repetition. ?After each set of 10 deep breaths, practice coughing to be sure your lungs are clear. If you have an incision (the cut made at the time of surgery), support your incision when coughing by placing a pillow or rolled up towels firmly against it. ?Once you are able to get out of bed, walk around indoors and cough well. You may stop using the incentive spirometer when instructed by your caregiver.  ?RISKS AND COMPLICATIONS ?Take your time so you do not get dizzy or light-headed. ?If you are in pain, you may need to take or ask for pain medication before doing incentive spirometry. It is harder to take a deep breath if you are having pain. ?AFTER USE ?Rest and breathe slowly and easily. ?It can be helpful to keep track of a log of your progress. Your caregiver can provide you with a simple table to help with this. ?If you are using the spirometer at home, follow these instructions: ?SEEK MEDICAL CARE IF:  ?You are having difficultly using the spirometer. ?You have trouble using the spirometer as often as instructed. ?Your pain medication is not giving enough relief while using the spirometer. ?You develop fever of 100.5? F (38.1? C) or higher. ?SEEK IMMEDIATE MEDICAL CARE IF:  ?You cough up bloody sputum that had not been present before. ?You develop fever of 102? F (38.9? C) or greater. ?You develop worsening pain at or near the incision site. ?MAKE SURE YOU:   ?Understand these instructions. ?Will watch your condition. ?Will get help right away if you are not doing well or get worse. ?Document Released: 09/11/2006 Document Revised: 07/24/2011 Document Reviewed: 06/30/

## 2021-08-25 ENCOUNTER — Encounter (HOSPITAL_COMMUNITY)
Admission: RE | Admit: 2021-08-25 | Discharge: 2021-08-25 | Disposition: A | Discharge status: Home / Self Care | Payer: Medicare Other | Source: Ambulatory Visit | Attending: Orthopedic Surgery | Admitting: Orthopedic Surgery

## 2021-08-25 ENCOUNTER — Ambulatory Visit (HOSPITAL_COMMUNITY)
Admission: RE | Admit: 2021-08-25 | Discharge: 2021-08-25 | Disposition: A | Discharge status: Home / Self Care | Payer: Medicare Other | Source: Ambulatory Visit | Attending: Orthopedic Surgery | Admitting: Orthopedic Surgery

## 2021-08-25 ENCOUNTER — Encounter (HOSPITAL_COMMUNITY): Payer: Self-pay

## 2021-08-25 VITALS — BP 129/82 | HR 63 | Temp 97.9°F | Resp 18 | Ht 69.0 in | Wt 212.0 lb

## 2021-08-25 DIAGNOSIS — Z01818 Encounter for other preprocedural examination: Secondary | ICD-10-CM | POA: Diagnosis not present

## 2021-08-25 DIAGNOSIS — I2510 Atherosclerotic heart disease of native coronary artery without angina pectoris: Secondary | ICD-10-CM

## 2021-08-25 HISTORY — DX: Atherosclerotic heart disease of native coronary artery without angina pectoris: I25.10

## 2021-08-25 HISTORY — DX: Personal history of urinary calculi: Z87.442

## 2021-08-25 LAB — CBC
HCT: 39.1 % (ref 39.0–52.0)
Hemoglobin: 13.3 g/dL (ref 13.0–17.0)
MCH: 30.6 pg (ref 26.0–34.0)
MCHC: 34 g/dL (ref 30.0–36.0)
MCV: 89.9 fL (ref 80.0–100.0)
Platelets: 201 10*3/uL (ref 150–400)
RBC: 4.35 MIL/uL (ref 4.22–5.81)
RDW: 13.4 % (ref 11.5–15.5)
WBC: 5.3 10*3/uL (ref 4.0–10.5)
nRBC: 0 % (ref 0.0–0.2)

## 2021-08-25 LAB — SURGICAL PCR SCREEN
MRSA, PCR: NEGATIVE
Staphylococcus aureus: POSITIVE — AB

## 2021-08-25 LAB — BASIC METABOLIC PANEL
Anion gap: 7 (ref 5–15)
BUN: 21 mg/dL (ref 8–23)
CO2: 24 mmol/L (ref 22–32)
Calcium: 9 mg/dL (ref 8.9–10.3)
Chloride: 103 mmol/L (ref 98–111)
Creatinine, Ser: 0.89 mg/dL (ref 0.61–1.24)
GFR, Estimated: >60 mL/min (ref >60)
Glucose, Bld: 115 mg/dL — ABNORMAL HIGH (ref 70–99)
Potassium: 4.6 mmol/L (ref 3.5–5.1)
Sodium: 134 mmol/L — ABNORMAL LOW (ref 135–145)

## 2021-08-29 NOTE — Progress Notes (Signed)
Anesthesia Chart Review ? ? Case: [id number] Date/Time: 09/01/21 1245  ? Procedure: REVERSE SHOULDER ARTHROPLASTY (Right: Shoulder)  ? Anesthesia type: Choice  ? Pre-op diagnosis: RIGHT SHOULDER MASSIVE ROTATOR CUFF TEAR  ? Location: [facility] ROOM 07 / [facility]  ? Surgeons: Tania Ade, MD  ? ?  ? ? ?DISCUSSION:81 y.o. never smoker with h/o HTN, sleep apnea, prostate cancer, CAD (DES to RCA 2020), right shoulder rotator cuff tear scheduled for above procedure 09/01/2021 with Dr. Tania Ade.  ? ?Pt last seen by cardiology 03/10/2021. Stable at this visit with no cv sx, reports working out regularly without sx. Asymptomatic at PAT visit.   ? ?Anticipate pt can proceed with planned procedure barring acute status change.   ?VS: BP 129/82   Pulse 63   Temp 36.6 ?C (Oral)   Resp 18   Ht '5\' 9"'$  (1.753 m)   Wt 96.2 kg   SpO2 97%   BMI 31.31 kg/m?  ? ?PROVIDERS: ?Mateo Flow, MD is PCP  ? ?Cardiologist - Mathis Bud, MD ?  ?LABS: Labs reviewed: Acceptable for surgery. ?(all labs ordered are listed, but only abnormal results are displayed) ? ?Labs Reviewed  ?SURGICAL PCR SCREEN - Abnormal; Notable for the following components:  ?    Result Value  ? Staphylococcus aureus POSITIVE (*)   ? All other components within normal limits  ?BASIC METABOLIC PANEL - Abnormal; Notable for the following components:  ? Sodium 134 (*)   ? Glucose, Bld 115 (*)   ? All other components within normal limits  ?CBC  ?TYPE AND SCREEN  ? ? ? ?IMAGES: ? ? ?EKG: ? ? ?CV: ?Echo 02/20/2019 ?Findings  ?Mitral Valve  ?Mild mitral regurgitation.  ?Aortic Valve  ?Normal tricuspid aortic valve with pliable leaflets, no stenosis or  ?insufficiency.  ?Tricuspid Valve  ?Structurally normal tricuspid valve with no stenosis.  ?Trace tricuspid regurgitation.  ?Pulmonic Valve  ?Normal pulmonic valve structure and mobility.  ?No Doppler evidence of pulmonic stenosis or insufficiency.  ?Left Atrium  ?Left atrial volume index of 35 ml per meters squared BSA.   ?Mild Left atrial enlargement.  ?Left Ventricle  ?Normal left ventricular size and systolic function with no appreciable  ?segmental abnormality.  ?Ejection fraction is visually estimated at 55-60%  ?Diastolic function Appears normal  ?Normal left ventricular wall thickness  ?Right Atrium  ?Normal right atrium.  ?Right Ventricle  ?Normal right ventricle structure and function.  ?Pericardial Effusion  ?No evidence of pericardial effusion.  ?Pleural Effusion  ?No evidence of pleural effusion.  ?Miscellaneous  ?The aorta is within normal limits.  ?The Pulmonary artery is within normal limits.  ?Intact interatrial septum with no obvious shunt by color doppler.  ?IVC is normal and collapses  ?Past Medical History:  ?Diagnosis Date  ? Arthritis   ? Coronary artery disease   ? History of kidney stones   ? Hyperlipemia   ? Hypertension   ? Lumbar spondylosis   ? Prostate cancer ([place]) 2000  ? Sleep apnea   ? v pap  ? ? ?Past Surgical History:  ?Procedure Laterality Date  ? ANKLE FRACTURE SURGERY    ? APPENDECTOMY    ? BACK SURGERY    ? CARDIAC CATHETERIZATION    ? stents  ? HERNIA REPAIR    ? PROSTATE SURGERY    ? TOTAL HIP ARTHROPLASTY Left 03/08/2015  ? Procedure: TOTAL HIP ARTHROPLASTY ANTERIOR APPROACH;  Surgeon: Frederik Pear, MD;  Location: [place];  Service: Orthopedics;  Laterality:  Left;  ? ? ?MEDICATIONS: ? aspirin 81 MG tablet  ? atorvastatin (LIPITOR) 80 MG tablet  ? b complex vitamins tablet  ? CALCIUM PO  ? ezetimibe (ZETIA) 10 MG tablet  ? gabapentin (NEURONTIN) 600 MG tablet  ? hydrOXYzine (ATARAX/VISTARIL) 50 MG tablet  ? Ibuprofen-diphenhydrAMINE HCl (ADVIL PM) 200-25 MG CAPS  ? Lactobacillus (PROBIOTIC ACIDOPHILUS PO)  ? Menthol-Methyl Salicylate (SALONPAS PAIN RELIEF PATCH) PTCH  ? metoprolol succinate (TOPROL-XL) 25 MG 24 hr tablet  ? Multiple Vitamins-Minerals (MULTIVITAMIN WITH MINERALS) tablet  ? nitroGLYCERIN (NITROSTAT) 0.4 MG SL tablet  ? POTASSIUM PO  ? sodium chloride (OCEAN) 0.65 % SOLN nasal spray   ? travoprost, benzalkonium, (TRAVATAN) 0.004 % ophthalmic solution  ? valsartan (DIOVAN) 160 MG tablet  ? Vitamin A 2400 MCG (8000 UT) CAPS  ? zinc gluconate 50 MG tablet  ? ?No current facility-administered medications for this encounter.  ? ? ?Konrad Felix Ward, PA-C ?[facility] ?[phone] ? ? ? ? ? ?

## 2021-08-31 NOTE — Anesthesia Preprocedure Evaluation (Addendum)
Anesthesia Evaluation  ?Patient identified by MRN, date of birth, ID band ?Patient awake ? ? ? ?Reviewed: ?Allergy & Precautions, NPO status , Patient's Chart, lab work & pertinent test results, reviewed documented beta blocker date and time  ? ?History of Anesthesia Complications ?Negative for: history of anesthetic complications ? ?Airway ?Mallampati: II ? ?TM Distance: >3 FB ?Neck ROM: Full ? ? ? Dental ?no notable dental hx. ? ?  ?Pulmonary ?sleep apnea and Continuous Positive Airway Pressure Ventilation ,  ?  ?Pulmonary exam normal ? ? ? ? ? ? ? Cardiovascular ?hypertension, Pt. on home beta blockers and Pt. on medications ?Normal cardiovascular exam ? ? ?  ?Neuro/Psych ?negative neurological ROS ? negative psych ROS  ? GI/Hepatic ?negative GI ROS, Neg liver ROS,   ?Endo/Other  ?negative endocrine ROS ? Renal/GU ?negative Renal ROS  ?negative genitourinary ?  ?Musculoskeletal ? ?(+) Arthritis ,  ? Abdominal ?  ?Peds ? Hematology ?negative hematology ROS ?(+)   ?Anesthesia Other Findings ?Day of surgery medications reviewed with patient. ? Reproductive/Obstetrics ?negative OB ROS ? ?  ? ? ? ? ? ? ? ? ? ? ? ? ? ?  ?  ? ? ? ? ? ? ?Anesthesia Physical ?Anesthesia Plan ? ?ASA: 3 ? ?Anesthesia Plan: General  ? ?Post-op Pain Management: Tylenol PO (pre-op)* and Regional block*  ? ?Induction: Intravenous ? ?PONV Risk Score and Plan: 2 and Treatment may vary due to age or medical condition, Ondansetron and Dexamethasone ? ?Airway Management Planned: Oral ETT ? ?Additional Equipment: None ? ?Intra-op Plan:  ? ?Post-operative Plan: Extubation in OR ? ?Informed Consent: I have reviewed the patients History and Physical, chart, labs and discussed the procedure including the risks, benefits and alternatives for the proposed anesthesia with the patient or authorized representative who has indicated his/her understanding and acceptance.  ? ? ? ?Dental advisory given ? ?Plan Discussed with:  CRNA ? ?Anesthesia Plan Comments:   ? ? ? ? ? ?Anesthesia Quick Evaluation ? ?

## 2021-09-01 ENCOUNTER — Ambulatory Visit (HOSPITAL_COMMUNITY): Payer: Medicare Other

## 2021-09-01 ENCOUNTER — Ambulatory Visit (HOSPITAL_BASED_OUTPATIENT_CLINIC_OR_DEPARTMENT_OTHER): Payer: Medicare Other | Admitting: Anesthesiology

## 2021-09-01 ENCOUNTER — Encounter (HOSPITAL_COMMUNITY): Payer: Self-pay | Admitting: Orthopedic Surgery

## 2021-09-01 ENCOUNTER — Ambulatory Visit (HOSPITAL_COMMUNITY)
Admission: RE | Admit: 2021-09-01 | Discharge: 2021-09-01 | Disposition: A | Discharge status: Home / Self Care | Payer: Medicare Other | Attending: Orthopedic Surgery | Admitting: Orthopedic Surgery

## 2021-09-01 ENCOUNTER — Encounter (HOSPITAL_COMMUNITY)
Admission: RE | Disposition: A | Discharge status: Home / Self Care | Payer: Self-pay | Source: Home / Self Care | Attending: Orthopedic Surgery

## 2021-09-01 ENCOUNTER — Ambulatory Visit (HOSPITAL_COMMUNITY): Payer: Medicare Other | Admitting: Physician Assistant

## 2021-09-01 ENCOUNTER — Other Ambulatory Visit: Payer: Self-pay

## 2021-09-01 DIAGNOSIS — M6281 Muscle weakness (generalized): Secondary | ICD-10-CM | POA: Insufficient documentation

## 2021-09-01 DIAGNOSIS — I2510 Atherosclerotic heart disease of native coronary artery without angina pectoris: Secondary | ICD-10-CM | POA: Insufficient documentation

## 2021-09-01 DIAGNOSIS — I10 Essential (primary) hypertension: Secondary | ICD-10-CM

## 2021-09-01 DIAGNOSIS — S46011A Strain of muscle(s) and tendon(s) of the rotator cuff of right shoulder, initial encounter: Secondary | ICD-10-CM | POA: Insufficient documentation

## 2021-09-01 DIAGNOSIS — Z96611 Presence of right artificial shoulder joint: Secondary | ICD-10-CM | POA: Diagnosis not present

## 2021-09-01 DIAGNOSIS — M1990 Unspecified osteoarthritis, unspecified site: Secondary | ICD-10-CM | POA: Insufficient documentation

## 2021-09-01 DIAGNOSIS — E785 Hyperlipidemia, unspecified: Secondary | ICD-10-CM | POA: Insufficient documentation

## 2021-09-01 DIAGNOSIS — Z8546 Personal history of malignant neoplasm of prostate: Secondary | ICD-10-CM | POA: Insufficient documentation

## 2021-09-01 DIAGNOSIS — G4730 Sleep apnea, unspecified: Secondary | ICD-10-CM | POA: Insufficient documentation

## 2021-09-01 DIAGNOSIS — Z9989 Dependence on other enabling machines and devices: Secondary | ICD-10-CM | POA: Diagnosis not present

## 2021-09-01 DIAGNOSIS — Z79899 Other long term (current) drug therapy: Secondary | ICD-10-CM | POA: Insufficient documentation

## 2021-09-01 DIAGNOSIS — Z7982 Long term (current) use of aspirin: Secondary | ICD-10-CM | POA: Diagnosis not present

## 2021-09-01 DIAGNOSIS — M75101 Unspecified rotator cuff tear or rupture of right shoulder, not specified as traumatic: Secondary | ICD-10-CM | POA: Diagnosis not present

## 2021-09-01 DIAGNOSIS — W19XXXA Unspecified fall, initial encounter: Secondary | ICD-10-CM | POA: Insufficient documentation

## 2021-09-01 DIAGNOSIS — G8918 Other acute postprocedural pain: Secondary | ICD-10-CM | POA: Diagnosis not present

## 2021-09-01 DIAGNOSIS — G4733 Obstructive sleep apnea (adult) (pediatric): Secondary | ICD-10-CM | POA: Diagnosis not present

## 2021-09-01 HISTORY — PX: REVERSE SHOULDER ARTHROPLASTY: SHX5054

## 2021-09-01 LAB — TYPE AND SCREEN
ABO/RH(D): A POS
Antibody Screen: NEGATIVE

## 2021-09-01 SURGERY — ARTHROPLASTY, SHOULDER, TOTAL, REVERSE
Anesthesia: General | Site: Shoulder | Laterality: Right

## 2021-09-01 MED ORDER — ONDANSETRON HCL 4 MG/2ML IJ SOLN
INTRAMUSCULAR | Status: DC | PRN
  Administered 2021-09-01: 4 mg via INTRAVENOUS

## 2021-09-01 MED ORDER — ACETAMINOPHEN 500 MG PO TABS
1000.0000 mg | ORAL_TABLET | Freq: Once | ORAL | Status: AC
  Administered 2021-09-01: 1000 mg via ORAL
  Filled 2021-09-01: qty 2

## 2021-09-01 MED ORDER — BUPIVACAINE LIPOSOME 1.3 % IJ SUSP
INTRAMUSCULAR | Status: DC | PRN
  Administered 2021-09-01: 10 mL via PERINEURAL

## 2021-09-01 MED ORDER — WATER FOR IRRIGATION, STERILE IR SOLN
Status: DC | PRN
  Administered 2021-09-01: 2000 mL

## 2021-09-01 MED ORDER — SODIUM CHLORIDE 0.9 % IR SOLN
Status: DC | PRN
  Administered 2021-09-01: 1000 mL

## 2021-09-01 MED ORDER — FENTANYL CITRATE PF 50 MCG/ML IJ SOSY: 25.0000 ug | PREFILLED_SYRINGE | INTRAMUSCULAR | Status: DC | PRN

## 2021-09-01 MED ORDER — MIDAZOLAM HCL 2 MG/2ML IJ SOLN
1.0000 mg | INTRAMUSCULAR | Status: DC
  Filled 2021-09-01: qty 2

## 2021-09-01 MED ORDER — LIDOCAINE 2% (20 MG/ML) 5 ML SYRINGE
INTRAMUSCULAR | Status: DC | PRN
Start: 2021-09-01 — End: 2021-09-01
  Administered 2021-09-01: 100 mg via INTRAVENOUS

## 2021-09-01 MED ORDER — BUPIVACAINE-EPINEPHRINE (PF) 0.5% -1:200000 IJ SOLN
INTRAMUSCULAR | Status: DC | PRN
  Administered 2021-09-01: 15 mL via PERINEURAL

## 2021-09-01 MED ORDER — CHLORHEXIDINE GLUCONATE 0.12 % MT SOLN
15.0000 mL | Freq: Once | OROMUCOSAL | Status: AC
  Administered 2021-09-01: 15 mL via OROMUCOSAL

## 2021-09-01 MED ORDER — PROPOFOL 10 MG/ML IV BOLUS
INTRAVENOUS | Status: AC
  Filled 2021-09-01: qty 20

## 2021-09-01 MED ORDER — ORAL CARE MOUTH RINSE: 15.0000 mL | Freq: Once | OROMUCOSAL | Status: AC

## 2021-09-01 MED ORDER — ROCURONIUM BROMIDE 10 MG/ML (PF) SYRINGE
PREFILLED_SYRINGE | INTRAVENOUS | Status: AC
  Filled 2021-09-01: qty 10

## 2021-09-01 MED ORDER — TIZANIDINE HCL 2 MG PO TABS: 2.0000 mg | ORAL_TABLET | Freq: Three times a day (TID) | ORAL | 0 refills | Status: AC | PRN

## 2021-09-01 MED ORDER — PHENYLEPHRINE HCL-NACL 20-0.9 MG/250ML-% IV SOLN
INTRAVENOUS | Status: DC | PRN
  Administered 2021-09-01: 50 ug/min via INTRAVENOUS

## 2021-09-01 MED ORDER — ROCURONIUM BROMIDE 10 MG/ML (PF) SYRINGE
PREFILLED_SYRINGE | INTRAVENOUS | Status: DC | PRN
  Administered 2021-09-01: 60 mg via INTRAVENOUS

## 2021-09-01 MED ORDER — LACTATED RINGERS IV SOLN: INTRAVENOUS | Status: DC

## 2021-09-01 MED ORDER — METHOCARBAMOL 500 MG PO TABS: 500.0000 mg | ORAL_TABLET | Freq: Four times a day (QID) | ORAL | Status: DC | PRN

## 2021-09-01 MED ORDER — DEXAMETHASONE SODIUM PHOSPHATE 10 MG/ML IJ SOLN
INTRAMUSCULAR | Status: AC
  Filled 2021-09-01: qty 1

## 2021-09-01 MED ORDER — EPHEDRINE 5 MG/ML INJ
INTRAVENOUS | Status: AC
  Filled 2021-09-01: qty 5

## 2021-09-01 MED ORDER — CEFAZOLIN SODIUM-DEXTROSE 2-4 GM/100ML-% IV SOLN
2.0000 g | INTRAVENOUS | Status: AC
  Administered 2021-09-01: 2 g via INTRAVENOUS
  Filled 2021-09-01: qty 100

## 2021-09-01 MED ORDER — OXYCODONE HCL 5 MG PO TABS: 5.0000 mg | ORAL_TABLET | Freq: Once | ORAL | Status: DC | PRN

## 2021-09-01 MED ORDER — PROPOFOL 10 MG/ML IV BOLUS
INTRAVENOUS | Status: DC | PRN
  Administered 2021-09-01: 160 mg via INTRAVENOUS

## 2021-09-01 MED ORDER — ONDANSETRON HCL 4 MG/2ML IJ SOLN
INTRAMUSCULAR | Status: AC
  Filled 2021-09-01: qty 2

## 2021-09-01 MED ORDER — OXYCODONE HCL 5 MG/5ML PO SOLN: 5.0000 mg | Freq: Once | ORAL | Status: DC | PRN

## 2021-09-01 MED ORDER — TRANEXAMIC ACID-NACL 1000-0.7 MG/100ML-% IV SOLN
1000.0000 mg | INTRAVENOUS | Status: AC
  Administered 2021-09-01: 1000 mg via INTRAVENOUS
  Filled 2021-09-01: qty 100

## 2021-09-01 MED ORDER — 0.9 % SODIUM CHLORIDE (POUR BTL) OPTIME
TOPICAL | Status: DC | PRN
  Administered 2021-09-01: 1000 mL

## 2021-09-01 MED ORDER — DEXAMETHASONE SODIUM PHOSPHATE 10 MG/ML IJ SOLN
INTRAMUSCULAR | Status: DC | PRN
  Administered 2021-09-01: 10 mg via INTRAVENOUS

## 2021-09-01 MED ORDER — LIDOCAINE HCL (PF) 2 % IJ SOLN
INTRAMUSCULAR | Status: AC
  Filled 2021-09-01: qty 5

## 2021-09-01 MED ORDER — OXYCODONE-ACETAMINOPHEN 5-325 MG PO TABS: 1.0000 | ORAL_TABLET | Freq: Four times a day (QID) | ORAL | 0 refills | Status: AC | PRN

## 2021-09-01 MED ORDER — SUGAMMADEX SODIUM 200 MG/2ML IV SOLN
INTRAVENOUS | Status: DC | PRN
Start: 2021-09-01 — End: 2021-09-01
  Administered 2021-09-01: 200 mg via INTRAVENOUS

## 2021-09-01 MED ORDER — METHOCARBAMOL 500 MG IVPB - SIMPLE MED
500.0000 mg | Freq: Four times a day (QID) | INTRAVENOUS | Status: DC | PRN
  Filled 2021-09-01: qty 50

## 2021-09-01 MED ORDER — EPHEDRINE SULFATE-NACL 50-0.9 MG/10ML-% IV SOSY
PREFILLED_SYRINGE | INTRAVENOUS | Status: DC | PRN
  Administered 2021-09-01: 10 mg via INTRAVENOUS
  Administered 2021-09-01 (×2): 5 mg via INTRAVENOUS

## 2021-09-01 MED ORDER — FENTANYL CITRATE PF 50 MCG/ML IJ SOSY
50.0000 ug | PREFILLED_SYRINGE | INTRAMUSCULAR | Status: DC
  Administered 2021-09-01: 50 ug via INTRAVENOUS
  Filled 2021-09-01: qty 2

## 2021-09-01 SURGICAL SUPPLY — 78 items
BAG COUNTER SPONGE SURGICOUNT (BAG) ×1 IMPLANT
BAG ZIPLOCK 12X15 (MISCELLANEOUS) ×2 IMPLANT
BASEPLATE P2 COATD GLND 6.5X30 (Shoulder) IMPLANT
BIT DRILL 1.6MX128 (BIT) IMPLANT
BIT DRILL 2.5 DIA 127 CALI (BIT) ×1 IMPLANT
BIT DRILL 4 DIA CALIBRATED (BIT) ×1 IMPLANT
BLADE SAW SAG 73X25 THK (BLADE) ×1
BLADE SAW SGTL 73X25 THK (BLADE) ×1 IMPLANT
BOOTIES KNEE HIGH SLOAN (MISCELLANEOUS) ×4 IMPLANT
COOLER ICEMAN CLASSIC (MISCELLANEOUS) ×1 IMPLANT
COVER BACK TABLE 60X90IN (DRAPES) ×2 IMPLANT
COVER SURGICAL LIGHT HANDLE (MISCELLANEOUS) ×2 IMPLANT
DRAPE INCISE IOBAN 66X45 STRL (DRAPES) ×2 IMPLANT
DRAPE ORTHO SPLIT 77X108 STRL (DRAPES) ×4
DRAPE POUCH INSTRU U-SHP 10X18 (DRAPES) ×2 IMPLANT
DRAPE SHEET LG 3/4 BI-LAMINATE (DRAPES) ×2 IMPLANT
DRAPE SURG 17X11 SM STRL (DRAPES) ×2 IMPLANT
DRAPE SURG ORHT 6 SPLT 77X108 (DRAPES) ×2 IMPLANT
DRAPE TOP 10253 STERILE (DRAPES) ×2 IMPLANT
DRAPE U-SHAPE 47X51 STRL (DRAPES) ×2 IMPLANT
DRSG AQUACEL AG ADV 3.5X 6 (GAUZE/BANDAGES/DRESSINGS) ×2 IMPLANT
DURAPREP 26ML APPLICATOR (WOUND CARE) ×4 IMPLANT
ELECT BLADE TIP CTD 4 INCH (ELECTRODE) ×3 IMPLANT
ELECT REM PT RETURN 15FT ADLT (MISCELLANEOUS) ×2 IMPLANT
GLOVE BIO SURGEON STRL SZ7 (GLOVE) ×2 IMPLANT
GLOVE BIO SURGEON STRL SZ7.5 (GLOVE) ×2 IMPLANT
GLOVE BIOGEL PI IND STRL 6.5 (GLOVE) ×1 IMPLANT
GLOVE BIOGEL PI IND STRL 7.0 (GLOVE) ×1 IMPLANT
GLOVE BIOGEL PI IND STRL 8 (GLOVE) ×1 IMPLANT
GLOVE BIOGEL PI INDICATOR 6.5 (GLOVE) ×1
GLOVE BIOGEL PI INDICATOR 7.0 (GLOVE) ×1
GLOVE BIOGEL PI INDICATOR 8 (GLOVE) ×1
GLOVE SURG POLYISO LF SZ6.5 (GLOVE) ×2 IMPLANT
GOWN STRL REUS W/ TWL XL LVL3 (GOWN DISPOSABLE) ×1 IMPLANT
GOWN STRL REUS W/TWL XL LVL3 (GOWN DISPOSABLE) ×2
HANDPIECE INTERPULSE COAX TIP (DISPOSABLE) ×2
HOOD PEEL AWAY FLYTE STAYCOOL (MISCELLANEOUS) ×6 IMPLANT
INSERT EPOLY STND HUMERUS 32MM (Shoulder) ×2 IMPLANT
INSERT EPOLYSTD HUMERUS 32MM (Shoulder) IMPLANT
KIT BASIN OR (CUSTOM PROCEDURE TRAY) ×2 IMPLANT
KIT TURNOVER KIT A (KITS) IMPLANT
MANIFOLD NEPTUNE II (INSTRUMENTS) ×2 IMPLANT
NDL TROCAR POINT SZ 2 1/2 (NEEDLE) IMPLANT
NEEDLE TROCAR POINT SZ 2 1/2 (NEEDLE) ×2 IMPLANT
NS IRRIG 1000ML POUR BTL (IV SOLUTION) ×2 IMPLANT
P2 COATDE GLNOID BSEPLT 6.5X30 (Shoulder) ×2 IMPLANT
PACK SHOULDER (CUSTOM PROCEDURE TRAY) ×2 IMPLANT
PAD COLD SHLDR WRAP-ON (PAD) ×1 IMPLANT
PROTECTOR NERVE ULNAR (MISCELLANEOUS) IMPLANT
RESTRAINT HEAD UNIVERSAL NS (MISCELLANEOUS) IMPLANT
RETRIEVER SUT HEWSON (MISCELLANEOUS) ×1 IMPLANT
SCREW BONE LOCKING RSP 5.0X30 (Screw) ×4 IMPLANT
SCREW BONE RSP LOCK 5X18 (Screw) IMPLANT
SCREW BONE RSP LOCK 5X30 (Screw) IMPLANT
SCREW BONE RSP LOCKING 18MM LG (Screw) ×4 IMPLANT
SCREW RETAIN W/HEAD 32MM (Shoulder) ×1 IMPLANT
SET HNDPC FAN SPRY TIP SCT (DISPOSABLE) ×1 IMPLANT
SLING ARM IMMOBILIZER LRG (SOFTGOODS) ×1 IMPLANT
SLING ARM IMMOBILIZER MED (SOFTGOODS) IMPLANT
SPONGE T-LAP 18X18 ~~LOC~~+RFID (SPONGE) ×1 IMPLANT
SPONGE T-LAP 4X18 ~~LOC~~+RFID (SPONGE) ×1 IMPLANT
STEM HUMERAL 12X48 STD SHORT (Shoulder) ×1 IMPLANT
STRIP CLOSURE SKIN 1/2X4 (GAUZE/BANDAGES/DRESSINGS) ×4 IMPLANT
SUCTION FRAZIER HANDLE 10FR (MISCELLANEOUS)
SUCTION TUBE FRAZIER 10FR DISP (MISCELLANEOUS) IMPLANT
SUPPORT WRAP ARM LG (MISCELLANEOUS) IMPLANT
SUT ETHIBOND 2 V 37 (SUTURE) ×1 IMPLANT
SUT FIBERWIRE #2 38 REV NDL BL (SUTURE)
SUT MNCRL AB 4-0 PS2 18 (SUTURE) ×2 IMPLANT
SUT VIC AB 2-0 CT1 27 (SUTURE) ×2
SUT VIC AB 2-0 CT1 TAPERPNT 27 (SUTURE) ×1 IMPLANT
SUTURE FIBERWR#2 38 REV NDL BL (SUTURE) IMPLANT
TAPE LABRALWHITE 1.5X36 (TAPE) ×1 IMPLANT
TAPE SUT LABRALTAP WHT/BLK (SUTURE) ×1 IMPLANT
TOWEL OR 17X26 10 PK STRL BLUE (TOWEL DISPOSABLE) ×2 IMPLANT
TOWEL OR NON WOVEN STRL DISP B (DISPOSABLE) ×2 IMPLANT
TUBE SUCTION HIGH CAP CLEAR NV (SUCTIONS) ×1 IMPLANT
WATER STERILE IRR 1000ML POUR (IV SOLUTION) ×3 IMPLANT

## 2021-09-01 NOTE — Anesthesia Procedure Notes (Signed)
Anesthesia Regional Block: Interscalene brachial plexus block  ? ?Pre-Anesthetic Checklist: , timeout performed,  Correct Patient, Correct Site, Correct Laterality,  Correct Procedure, Correct Position, site marked,  Risks and benefits discussed,  Pre-op evaluation,  At surgeon's request and post-op pain management ? ?Laterality: Right ? ?Prep: Maximum Sterile Barrier Precautions used, chloraprep     ?  ?Needles:  ?Injection technique: Single-shot ? ?Needle Type: Echogenic Stimulator Needle   ? ? ?Needle Length: 9cm  ?Needle Gauge: 22  ? ? ? ?Additional Needles: ? ? ?Procedures:,,,, ultrasound used (permanent image in chart),,    ?Narrative:  ?Start time: 09/01/2021 8:37 AM ?End time: 09/01/2021 8:40 AM ?Injection made incrementally with aspirations every 5 mL. ? ?Performed by: Personally  ?Anesthesiologist: Brennan Bailey, MD ? ?Additional Notes: ?Risks, benefits, and alternative discussed. Patient gave consent for procedure. Patient prepped and draped in sterile fashion. Sedation administered, patient remains easily responsive to voice. Relevant anatomy identified with ultrasound guidance. Local anesthetic given in 5cc increments with no signs or symptoms of intravascular injection. No pain or paraesthesias with injection. Patient monitored throughout procedure with signs of LAST or immediate complications. Tolerated well. Ultrasound image placed in chart.  ?Tawny Asal, MD ? ? ? ? ? ? ?

## 2021-09-01 NOTE — Evaluation (Signed)
Occupational Therapy Evaluation ?Patient Details ?Name: Chad Smith ?MRN: [id number] ?DOB: 08/18/1940 ?Today's Date: 09/01/2021 ? ? ?History of Present Illness Patient s/p R reverse TSA  ? ?Clinical Impression ?  ?Mr. Chad Smith is an 81 year old man s/p shoulder replacement without functional use of right dominant upper extremity secondary to effects of surgery and interscalene block and shoulder precautions. Therapist provided education and instruction to patient and spouse in regards to exercises, precautions, positioning, donning upper extremity clothing and bathing while maintaining shoulder precautions, ice and edema management and donning/doffing sling. Patient and spouse verbalized understanding and demonstrated as needed. Patient needed assistance to donn shirt, underwear, pants, socks and shoes and provided with instruction on compensatory strategies to perform ADLs. Patient to follow up with MD for further therapy needs.  ?  ?   ? ?Recommendations for follow up therapy are one component of a multi-disciplinary discharge planning process, led by the attending physician.  Recommendations may be updated based on patient status, additional functional criteria and insurance authorization.  ? ?Follow Up Recommendations ? Follow physician's recommendations for discharge plan and follow up therapies  ?  ?Assistance Recommended at Discharge Frequent or constant Supervision/Assistance  ?Patient can return home with the following A little help with bathing/dressing/bathroom;Assistance with cooking/housework ? ?  ?Functional Status Assessment ? Patient has had a recent decline in their functional status and demonstrates the ability to make significant improvements in function in a reasonable and predictable amount of time.  ?Equipment Recommendations ? None recommended by OT  ?  ?Recommendations for Other Services   ? ? ?  ?Precautions / Restrictions Precautions ?Precautions: Shoulder ?Type of Shoulder Precautions:  No AROM, No PROM ?Shoulder Interventions: Shoulder sling/immobilizer;At all times ?Precaution Booklet Issued:  (handouts) ?Required Braces or Orthoses: Sling ?Restrictions ?Weight Bearing Restrictions: Yes ?RUE Weight Bearing: Non weight bearing  ? ?  ? ?Mobility Bed Mobility ?  ?  ?  ?  ?  ?  ?  ?General bed mobility comments: in chair ?  ? ?Transfers ?Overall transfer level: Independent ?  ?  ?  ?  ?  ?  ?  ?  ?  ?  ? ?  ?Balance Overall balance assessment: Mild deficits observed, not formally tested ?  ?  ?  ?  ?  ?  ?  ?  ?  ?  ?  ?  ?  ?  ?  ?  ?  ?  ?   ? ?ADL either performed or assessed with clinical judgement  ? ?ADL   ?  ?  ?  ?  ?  ?  ?  ?  ?  ?  ?  ?  ?  ?  ?  ?  ?  ?  ?  ?   ? ? ? ?Vision Baseline Vision/History: 1 Wears glasses ?   ?   ?Perception   ?  ?Praxis   ?  ? ?Pertinent Vitals/Pain Pain Assessment ?Pain Assessment: No/denies pain  ? ? ? ?Hand Dominance   ?  ?Extremity/Trunk Assessment Upper Extremity Assessment ?Upper Extremity Assessment: RUE deficits/detail ?RUE Deficits / Details: impaired AROM and sensation secondary to block ?  ?Lower Extremity Assessment ?Lower Extremity Assessment: Overall WFL for tasks assessed ?  ?Cervical / Trunk Assessment ?Cervical / Trunk Assessment: Normal ?  ?Communication   ?  ?Cognition Arousal/Alertness: Awake/alert ?Behavior During Therapy: [name] for tasks assessed/performed ?Overall Cognitive Status: Within Functional Limits for tasks assessed ?  ?  ?  ?  ?  ?  ?  ?  ?  ?  ?  ?  ?  ?  ?  ?  ?  ?  ?  ?  General Comments  Uses cane at baseline ? ?  ?Exercises   ?  ?Shoulder Instructions    ? ? ?Home Living Family/patient expects to be discharged to:: Private residence ?Living Arrangements: Spouse/significant other ?Available Help at Discharge: Family;Available 24 hours/day ?  ?  ?  ?  ?  ?  ?  ?  ?  ?  ?  ?  ?  ?  ?  ?  ? ?  ?Prior Functioning/Environment   ?  ?  ?  ?  ?  ?  ?  ?  ?  ? ?  ?  ?OT Problem List: Decreased strength;Decreased range of motion;Impaired  UE functional use ?  ?   ?OT Treatment/Interventions:    ?  ?OT Goals(Current goals can be found in the care plan section) Acute Rehab OT Goals ?OT Goal Formulation: All assessment and education complete, DC therapy  ?OT Frequency:   ?  ? ?Co-evaluation   ?  ?  ?  ?  ? ?  ?AM-PAC OT "6 Clicks" Daily Activity     ?Outcome Measure Help from another person eating meals?: A Little ?Help from another person taking care of personal grooming?: A Little ?Help from another person toileting, which includes using toliet, bedpan, or urinal?: A Little ?Help from another person bathing (including washing, rinsing, drying)?: A Little ?Help from another person to put on and taking off regular upper body clothing?: A Lot ?Help from another person to put on and taking off regular lower body clothing?: A Little ?6 Click Score: 17 ?  ?End of Session Nurse Communication:  (OT education complete) ? ?Activity Tolerance: Patient tolerated treatment well ?Patient left: in chair;with family/visitor present ? ?OT Visit Diagnosis: Muscle weakness (generalized) (M62.81)  ?              ?Time: 1130-1159 ?OT Time Calculation (min): 29 min ?Charges:  OT General Charges ?$OT Visit: 1 Visit ?OT Evaluation ?$OT Eval Low Complexity: 1 Low ?OT Treatments ?$Self Care/Home Management : 8-22 mins ? ?Chad Smith, OTR/Chad ?Acute Care Rehab Services  ?Office [phone] ?Pager: [phone]  ? ?Chad Smith Chad Smith ?09/01/2021, 12:24 PM ?

## 2021-09-01 NOTE — H&P (Signed)
Chad Smith is an 81 y.o. male.   ?Chief Complaint: Right shoulder dysfunction ?HPI: Status post fall with massive rotator cuff tear and pseudoparalysis, indicated for surgery to improve function ? ?Past Medical History:  ?Diagnosis Date  ? Arthritis   ? Coronary artery disease   ? History of kidney stones   ? Hyperlipemia   ? Hypertension   ? Lumbar spondylosis   ? Prostate cancer ([place]) 2000  ? Sleep apnea   ? v pap  ? ? ?Past Surgical History:  ?Procedure Laterality Date  ? ANKLE FRACTURE SURGERY    ? APPENDECTOMY    ? BACK SURGERY    ? CARDIAC CATHETERIZATION    ? stents  ? HERNIA REPAIR    ? PROSTATE SURGERY    ? TOTAL HIP ARTHROPLASTY Left 03/08/2015  ? Procedure: TOTAL HIP ARTHROPLASTY ANTERIOR APPROACH;  Surgeon: Frederik Pear, MD;  Location: [facility];  Service: Orthopedics;  Laterality: Left;  ? ? ?Family History  ?Problem Relation Age of Onset  ? Stroke Mother   ? Prostate cancer Father   ? Breast cancer Sister   ? ?Social History:  reports that he has never smoked. He has never used smokeless tobacco. He reports current alcohol use. He reports that he does not use drugs. ? ?Allergies: No Known Allergies ? ?Medications Prior to Admission  ?Medication Sig Dispense Refill  ? aspirin 81 MG tablet Take 162 mg by mouth at bedtime.    ? atorvastatin (LIPITOR) 80 MG tablet Take 80 mg by mouth every evening.    ? b complex vitamins tablet Take 1 tablet by mouth daily.    ? CALCIUM PO Take 2 tablets by mouth daily.    ? ezetimibe (ZETIA) 10 MG tablet Take 10 mg by mouth daily.    ? gabapentin (NEURONTIN) 600 MG tablet Take 1.5 tablets (900 mg total) by mouth 4 (four) times daily. 540 tablet 0  ? hydrOXYzine (ATARAX/VISTARIL) 50 MG tablet Take 100 mg by mouth at bedtime.  2  ? Ibuprofen-diphenhydrAMINE HCl (ADVIL PM) 200-25 MG CAPS Take 2 tablets by mouth at bedtime as needed (sleep).    ? Lactobacillus (PROBIOTIC ACIDOPHILUS PO) Take 1 capsule by mouth daily.    ? Menthol-Methyl Salicylate (SALONPAS PAIN RELIEF PATCH)  PTCH Apply 2 patches topically at bedtime.    ? metoprolol succinate (TOPROL-XL) 25 MG 24 hr tablet Take 25 mg by mouth daily.    ? Multiple Vitamins-Minerals (MULTIVITAMIN WITH MINERALS) tablet Take 1 tablet by mouth daily.    ? nitroGLYCERIN (NITROSTAT) 0.4 MG SL tablet Place 0.4 mg under the tongue every 5 (five) minutes as needed for chest pain.    ? POTASSIUM PO Take 1 tablet by mouth daily.    ? sodium chloride (OCEAN) 0.65 % SOLN nasal spray Place 1 spray into both nostrils as needed for congestion.    ? travoprost, benzalkonium, (TRAVATAN) 0.004 % ophthalmic solution Place 1 drop into both eyes at bedtime.    ? valsartan (DIOVAN) 160 MG tablet Take 160 mg by mouth at bedtime.    ? Vitamin A 2400 MCG (8000 UT) CAPS Take 4,800 mcg by mouth daily.    ? zinc gluconate 50 MG tablet Take 50 mg by mouth daily.    ? ? ?No results found for this or any previous visit (from the past 48 hour(s)). ?No results found. ? ?Review of Systems  ?All other systems reviewed and are negative. ? ?Blood pressure 124/77, pulse 68, temperature 98 ?F (  36.7 ?C), temperature source Oral, resp. rate 16, height '5\' 9"'$  (1.753 m), weight 96.2 kg, SpO2 97 %. ?Physical Exam ?Constitutional:   ?   Appearance: He is well-developed.  ?HENT:  ?   Head: Atraumatic.  ?Eyes:  ?   Extraocular Movements: Extraocular movements intact.  ?Cardiovascular:  ?   Pulses: Normal pulses.  ?Pulmonary:  ?   Effort: Pulmonary effort is normal.  ?Musculoskeletal:  ?   Comments: Right shoulder pseudo paralytic with attempted forward flexion.  Distally neurovascularly intact.  ?Skin: ?   General: Skin is warm and dry.  ?Neurological:  ?   Mental Status: He is alert and oriented to person, place, and time.  ?Psychiatric:     ?   Mood and Affect: Mood normal.  ?  ? ?Assessment/Plan ?Status post fall with massive rotator cuff tear and pseudoparalysis, indicated for surgery to improve function ?Plan right reverse total shoulder arthroplasty ?Risks / benefits of surgery  discussed ?Consent on chart  ?NPO for OR ?Preop antibiotics ? ? ?Rhae Hammock, MD ?09/01/2021, 8:32 AM ? ? ? ?

## 2021-09-01 NOTE — Progress Notes (Signed)
AssistedDr. Birdie Sons with right, interscalene , ultrasound guided block. Side rails up, monitors on throughout procedure. See vital signs in flow sheet. Tolerated Procedure well. ? ?

## 2021-09-01 NOTE — Discharge Instructions (Addendum)

## 2021-09-01 NOTE — Op Note (Signed)
Procedure(s): ?REVERSE SHOULDER ARTHROPLASTY Procedure Note ? ?Chad Smith ?male ?81 y.o. ?09/01/2021 ? ?Preoperative diagnosis: Right shoulder massive rotator cuff tear ? ?Postoperative diagnosis: Same ? ?Procedure(s) and Anesthesia Type: ?   * REVERSE SHOULDER ARTHROPLASTY - Choice ? ? ?Indications:  81 y.o. male  With massive retracted rotator cuff tear involving the entire supraspinatus and infraspinatus.  He is pseudo paralytic and unable to raise his arm actively.  Given the size of the tear and his age I felt he was indicated for reverse shoulder arthroplasty to give the most reliable outcome for return of function. ?    ?Surgeon: Rhae Hammock  ? ?Assistants: Forensic psychologist was present and scrubbed throughout the procedure and was essential in positioning, retraction, exposure, and closure) ? ?Anesthesia: General endotracheal anesthesia with preoperative interscalene block given by the attending anesthesiologist ? ? ? ?Procedure Detail ? ?REVERSE SHOULDER ARTHROPLASTY ? ? ?Estimated Blood Loss:  200 mL ?        ?Drains: none ? ?Blood Given: none  ?        ?Specimens: none ?       ?Complications:  * No complications entered in OR log * ?        ?Disposition: PACU - hemodynamically stable. ?        ?Condition: stable ?   ? ? ?OPERATIVE FINDINGS:  ?A DJO Altivate pressfit reverse total shoulder arthroplasty was placed with a  ?size 12 stem, a 32 standard glenosphere, and a standard-mm poly insert. The base plate  ?fixation was excellent. ? ?PROCEDURE: The patient was identified in the preoperative holding area  ?where I personally marked the operative site after verifying site, side,  ?and procedure with the patient. An interscalene block given by  ?the attending anesthesiologist in the holding area and the patient was taken back to the operating room where all extremities were  ?carefully padded in position after general anesthesia was induced. She  ?was placed in a beach-chair position and  the operative upper extremity was  ?prepped and draped in a standard sterile fashion. An approximately 10-  ?cm incision was made from the tip of the coracoid process to the center  ?point of the humerus at the level of the axilla. Dissection was carried  ?down through subcutaneous tissues to the level of the cephalic vein  ?which was taken laterally with the deltoid. The pectoralis major was  ?retracted medially. The subdeltoid space was developed and the lateral  ?edge of the conjoined tendon was identified. The undersurface of  ?conjoined tendon was palpated and the musculocutaneous nerve was not in  ?the field. Retractor was placed underneath the conjoined and second  ?retractor was placed lateral into the deltoid. The circumflex humeral  ?artery and vessels were identified and clamped and coagulated. The  ?biceps tendon was tenodesed to the upper border of the pectoralis major.  The subscapularis was taken down as a peel with the underlying capsule.  The  ?joint was then gently externally rotated while the capsule was released  ?from the humeral neck around to just beyond the 6 o'clock position. At  ?this point, the joint was dislocated and the humeral head was presented  ?into the wound. The excessive osteophyte formation was removed with a  ?large rongeur.  The cutting guide was used to make the appropriate  ?head cut and the head was saved for potentially bone grafting.  The glenoid was exposed with the arm in an  ?abducted extended position.  The anterior and posterior labrum were  ?completely excised and the capsule was released circumferentially to  ?allow for exposure of the glenoid for preparation. The 2.5 mm drill was  ?placed using the guide in 5-10? inferior angulation and the tap was then advanced in the same hole. Small and large reamers were then used. The tap was then removed and the Metaglene was then screwed in with excellent purchase.  The peripheral guide was then used to drilled measured and  filled peripheral locking screws. The size 32 standard glenosphere was then impacted on the [facility] taper and the central screw was placed. The humerus was then again exposed and the diaphyseal reamers were used followed by the metaphyseal reamers. The final broach was left in place in the proximal trial was placed. The joint was reduced and with this implant it was felt that soft tissue tensioning was appropriate with excellent stability and excellent range of motion. Therefore, final humeral stem was placed press-fit.  And then the trial polyethylene inserts were tested again and the above implant was felt to be the most appropriate for final insertion. The joint was reduced taken through full range of motion and felt to be stable. Soft tissue tension was appropriate.  ?The joint was then copiously irrigated with pulse  ?lavage and the wound was then closed. The subscapularis was repaired with labral tapes passed through predrilled tunnels and around the implant.  Skin was closed with 2-0 Vicryl in a deep dermal layer and 4-0  ?Monocryl for skin closure. Steri-Strips were applied. Sterile  ?dressings were then applied as well as a sling. The patient was allowed  ?to awaken from general anesthesia, transferred to stretcher, and taken  ?to recovery room in stable condition.  ? ?POSTOPERATIVE PLAN: The patient will be observed in the recovery room and if his pain is well controlled with the regional block and he is hemodynamically stable he could be discharged home today with family. ?

## 2021-09-01 NOTE — Anesthesia Postprocedure Evaluation (Signed)
Anesthesia Post Note ? ?Patient: Chad Smith ? ?Procedure(s) Performed: REVERSE SHOULDER ARTHROPLASTY (Right: Shoulder) ? ?  ? ?Patient location during evaluation: PACU ?Anesthesia Type: General ?Level of consciousness: awake and alert ?Pain management: pain level controlled ?Vital Signs Assessment: post-procedure vital signs reviewed and stable ?Respiratory status: spontaneous breathing, nonlabored ventilation and respiratory function stable ?Cardiovascular status: blood pressure returned to baseline ?Postop Assessment: no apparent nausea or vomiting ?Anesthetic complications: no ? ? ?No notable events documented. ? ?Last Vitals:  ?Vitals:  ? 09/01/21 1100 09/01/21 1115  ?BP: 126/72 118/71  ?Pulse: 68 68  ?Resp: 20 20  ?Temp:    ?SpO2: 93% 94%  ?  ?Last Pain:  ?Vitals:  ? 09/01/21 1115  ?TempSrc:   ?PainSc: 0-No pain  ? ? ?  ?  ?  ?  ?  ?  ? ?Marthenia Rolling ? ? ? ? ?

## 2021-09-01 NOTE — Transfer of Care (Signed)
Immediate Anesthesia Transfer of Care Note ? ?Patient: CAROLINE MATTERS ? ?Procedure(s) Performed: REVERSE SHOULDER ARTHROPLASTY (Right: Shoulder) ? ?Patient Location: PACU ? ?Anesthesia Type:General and Regional ? ?Level of Consciousness: awake, alert  and oriented ? ?Airway & Oxygen Therapy: Patient Spontanous Breathing and Patient connected to face mask oxygen ? ?Post-op Assessment: Report given to RN and Post -op Vital signs reviewed and stable ? ?Post vital signs: Reviewed and stable ? ?Last Vitals:  ?Vitals Value Taken Time  ?BP 121/66 09/01/21 1039  ?Temp    ?Pulse 65 09/01/21 1040  ?Resp 23 09/01/21 1040  ?SpO2 97 % 09/01/21 1040  ?Vitals shown include unvalidated device data. ? ?Last Pain:  ?Vitals:  ? 09/01/21 0800  ?TempSrc:   ?PainSc: 0-No pain  ?   ? ?  ? ?Complications: No notable events documented. ?

## 2021-09-01 NOTE — Anesthesia Procedure Notes (Signed)
Procedure Name: Intubation ?Date/Time: 09/01/2021 9:23 AM ?Performed by: Delynn Pursley D, CRNA ?Pre-anesthesia Checklist: Patient identified, Emergency Drugs available, Suction available and Patient being monitored ?Patient Re-evaluated:Patient Re-evaluated prior to induction ?Oxygen Delivery Method: Circle system utilized ?Preoxygenation: Pre-oxygenation with 100% oxygen ?Induction Type: IV induction ?Ventilation: Mask ventilation without difficulty ?Laryngoscope Size: Mac and 4 ?Grade View: Grade I ?Tube type: Oral ?Tube size: 7.5 mm ?Number of attempts: 1 ?Airway Equipment and Method: Stylet ?Placement Confirmation: ETT inserted through vocal cords under direct vision, positive ETCO2 and breath sounds checked- equal and bilateral ?Secured at: 22 cm ?Tube secured with: Tape ?Dental Injury: Teeth and Oropharynx as per pre-operative assessment  ? ? ? ? ?

## 2021-09-02 ENCOUNTER — Encounter (HOSPITAL_COMMUNITY): Payer: Self-pay | Admitting: Orthopedic Surgery

## 2021-09-06 DIAGNOSIS — J302 Other seasonal allergic rhinitis: Secondary | ICD-10-CM | POA: Diagnosis not present

## 2021-09-06 DIAGNOSIS — R062 Wheezing: Secondary | ICD-10-CM | POA: Diagnosis not present

## 2021-09-06 DIAGNOSIS — R0989 Other specified symptoms and signs involving the circulatory and respiratory systems: Secondary | ICD-10-CM | POA: Diagnosis not present

## 2021-09-16 DIAGNOSIS — Z9889 Other specified postprocedural states: Secondary | ICD-10-CM | POA: Diagnosis not present

## 2021-10-25 DIAGNOSIS — R0989 Other specified symptoms and signs involving the circulatory and respiratory systems: Secondary | ICD-10-CM | POA: Diagnosis not present

## 2021-10-28 DIAGNOSIS — Z96611 Presence of right artificial shoulder joint: Secondary | ICD-10-CM | POA: Diagnosis not present

## 2021-10-28 DIAGNOSIS — M25511 Pain in right shoulder: Secondary | ICD-10-CM | POA: Diagnosis not present

## 2021-10-31 DIAGNOSIS — Z96611 Presence of right artificial shoulder joint: Secondary | ICD-10-CM | POA: Diagnosis not present

## 2021-10-31 DIAGNOSIS — M25511 Pain in right shoulder: Secondary | ICD-10-CM | POA: Diagnosis not present

## 2021-11-03 DIAGNOSIS — M25511 Pain in right shoulder: Secondary | ICD-10-CM | POA: Diagnosis not present

## 2021-11-03 DIAGNOSIS — Z96611 Presence of right artificial shoulder joint: Secondary | ICD-10-CM | POA: Diagnosis not present

## 2021-11-04 DIAGNOSIS — M150 Primary generalized (osteo)arthritis: Secondary | ICD-10-CM | POA: Diagnosis not present

## 2021-11-04 DIAGNOSIS — G4733 Obstructive sleep apnea (adult) (pediatric): Secondary | ICD-10-CM | POA: Diagnosis not present

## 2021-11-04 DIAGNOSIS — M353 Polymyalgia rheumatica: Secondary | ICD-10-CM | POA: Diagnosis not present

## 2021-11-08 DIAGNOSIS — Z96611 Presence of right artificial shoulder joint: Secondary | ICD-10-CM | POA: Diagnosis not present

## 2021-11-08 DIAGNOSIS — M25511 Pain in right shoulder: Secondary | ICD-10-CM | POA: Diagnosis not present

## 2021-11-10 DIAGNOSIS — Z7982 Long term (current) use of aspirin: Secondary | ICD-10-CM | POA: Diagnosis not present

## 2021-11-10 DIAGNOSIS — Z9989 Dependence on other enabling machines and devices: Secondary | ICD-10-CM | POA: Diagnosis not present

## 2021-11-10 DIAGNOSIS — I10 Essential (primary) hypertension: Secondary | ICD-10-CM | POA: Diagnosis not present

## 2021-11-10 DIAGNOSIS — M25511 Pain in right shoulder: Secondary | ICD-10-CM | POA: Diagnosis not present

## 2021-11-10 DIAGNOSIS — E782 Mixed hyperlipidemia: Secondary | ICD-10-CM | POA: Diagnosis not present

## 2021-11-10 DIAGNOSIS — Z96611 Presence of right artificial shoulder joint: Secondary | ICD-10-CM | POA: Diagnosis not present

## 2021-11-10 DIAGNOSIS — G4733 Obstructive sleep apnea (adult) (pediatric): Secondary | ICD-10-CM | POA: Diagnosis not present

## 2021-11-10 DIAGNOSIS — I2510 Atherosclerotic heart disease of native coronary artery without angina pectoris: Secondary | ICD-10-CM | POA: Diagnosis not present

## 2021-11-14 DIAGNOSIS — Z96611 Presence of right artificial shoulder joint: Secondary | ICD-10-CM | POA: Diagnosis not present

## 2021-11-14 DIAGNOSIS — M25511 Pain in right shoulder: Secondary | ICD-10-CM | POA: Diagnosis not present

## 2021-11-16 DIAGNOSIS — M25511 Pain in right shoulder: Secondary | ICD-10-CM | POA: Diagnosis not present

## 2021-11-16 DIAGNOSIS — Z96611 Presence of right artificial shoulder joint: Secondary | ICD-10-CM | POA: Diagnosis not present

## 2021-11-22 DIAGNOSIS — Z96611 Presence of right artificial shoulder joint: Secondary | ICD-10-CM | POA: Diagnosis not present

## 2021-11-22 DIAGNOSIS — M25511 Pain in right shoulder: Secondary | ICD-10-CM | POA: Diagnosis not present

## 2021-11-24 DIAGNOSIS — M25511 Pain in right shoulder: Secondary | ICD-10-CM | POA: Diagnosis not present

## 2021-11-24 DIAGNOSIS — Z96611 Presence of right artificial shoulder joint: Secondary | ICD-10-CM | POA: Diagnosis not present

## 2021-11-29 DIAGNOSIS — Z96611 Presence of right artificial shoulder joint: Secondary | ICD-10-CM | POA: Diagnosis not present

## 2021-11-29 DIAGNOSIS — M25511 Pain in right shoulder: Secondary | ICD-10-CM | POA: Diagnosis not present

## 2021-11-30 DIAGNOSIS — Z9889 Other specified postprocedural states: Secondary | ICD-10-CM | POA: Diagnosis not present

## 2021-12-01 DIAGNOSIS — H353112 Nonexudative age-related macular degeneration, right eye, intermediate dry stage: Secondary | ICD-10-CM | POA: Diagnosis not present

## 2021-12-01 DIAGNOSIS — D3132 Benign neoplasm of left choroid: Secondary | ICD-10-CM | POA: Diagnosis not present

## 2021-12-01 DIAGNOSIS — H353222 Exudative age-related macular degeneration, left eye, with inactive choroidal neovascularization: Secondary | ICD-10-CM | POA: Diagnosis not present

## 2022-01-02 DIAGNOSIS — C44612 Basal cell carcinoma of skin of right upper limb, including shoulder: Secondary | ICD-10-CM | POA: Diagnosis not present

## 2022-01-02 DIAGNOSIS — D225 Melanocytic nevi of trunk: Secondary | ICD-10-CM | POA: Diagnosis not present

## 2022-01-23 DIAGNOSIS — E782 Mixed hyperlipidemia: Secondary | ICD-10-CM | POA: Diagnosis not present

## 2022-01-30 DIAGNOSIS — E782 Mixed hyperlipidemia: Secondary | ICD-10-CM | POA: Diagnosis not present

## 2022-01-30 DIAGNOSIS — I10 Essential (primary) hypertension: Secondary | ICD-10-CM | POA: Diagnosis not present

## 2022-01-30 DIAGNOSIS — M5136 Other intervertebral disc degeneration, lumbar region: Secondary | ICD-10-CM | POA: Diagnosis not present

## 2022-01-30 DIAGNOSIS — Z0000 Encounter for general adult medical examination without abnormal findings: Secondary | ICD-10-CM | POA: Diagnosis not present

## 2022-01-30 DIAGNOSIS — G4733 Obstructive sleep apnea (adult) (pediatric): Secondary | ICD-10-CM | POA: Diagnosis not present

## 2022-02-17 ENCOUNTER — Other Ambulatory Visit (HOSPITAL_COMMUNITY): Payer: Self-pay | Admitting: Urology

## 2022-02-17 DIAGNOSIS — R972 Elevated prostate specific antigen [PSA]: Secondary | ICD-10-CM

## 2022-02-24 DIAGNOSIS — G4733 Obstructive sleep apnea (adult) (pediatric): Secondary | ICD-10-CM | POA: Diagnosis not present

## 2022-02-24 DIAGNOSIS — M353 Polymyalgia rheumatica: Secondary | ICD-10-CM | POA: Diagnosis not present

## 2022-02-24 DIAGNOSIS — M150 Primary generalized (osteo)arthritis: Secondary | ICD-10-CM | POA: Diagnosis not present

## 2022-02-27 ENCOUNTER — Encounter (HOSPITAL_COMMUNITY)
Admission: RE | Admit: 2022-02-27 | Discharge: 2022-02-27 | Disposition: A | Discharge status: Home / Self Care | Payer: Medicare Other | Source: Ambulatory Visit | Attending: Urology | Admitting: Urology

## 2022-02-27 DIAGNOSIS — R972 Elevated prostate specific antigen [PSA]: Secondary | ICD-10-CM | POA: Diagnosis present

## 2022-02-27 DIAGNOSIS — R911 Solitary pulmonary nodule: Secondary | ICD-10-CM | POA: Diagnosis not present

## 2022-02-27 MED ORDER — PIFLIFOLASTAT F 18 (PYLARIFY) INJECTION
9.0000 | Freq: Once | INTRAVENOUS | Status: AC
Start: 2022-02-27 — End: 2022-02-27
  Administered 2022-02-27: 8.5 via INTRAVENOUS

## 2022-03-01 DIAGNOSIS — Z23 Encounter for immunization: Secondary | ICD-10-CM | POA: Diagnosis not present

## 2022-03-08 DIAGNOSIS — S46011A Strain of muscle(s) and tendon(s) of the rotator cuff of right shoulder, initial encounter: Secondary | ICD-10-CM | POA: Diagnosis not present

## 2022-03-30 DIAGNOSIS — H52223 Regular astigmatism, bilateral: Secondary | ICD-10-CM | POA: Diagnosis not present

## 2022-03-30 DIAGNOSIS — H353131 Nonexudative age-related macular degeneration, bilateral, early dry stage: Secondary | ICD-10-CM | POA: Diagnosis not present

## 2022-06-08 DIAGNOSIS — H353222 Exudative age-related macular degeneration, left eye, with inactive choroidal neovascularization: Secondary | ICD-10-CM | POA: Diagnosis not present

## 2022-06-08 DIAGNOSIS — H353112 Nonexudative age-related macular degeneration, right eye, intermediate dry stage: Secondary | ICD-10-CM | POA: Diagnosis not present

## 2022-06-08 DIAGNOSIS — D3132 Benign neoplasm of left choroid: Secondary | ICD-10-CM | POA: Diagnosis not present

## 2022-06-27 DIAGNOSIS — M150 Primary generalized (osteo)arthritis: Secondary | ICD-10-CM | POA: Diagnosis not present

## 2022-06-27 DIAGNOSIS — G4733 Obstructive sleep apnea (adult) (pediatric): Secondary | ICD-10-CM | POA: Diagnosis not present

## 2022-06-27 DIAGNOSIS — M353 Polymyalgia rheumatica: Secondary | ICD-10-CM | POA: Diagnosis not present

## 2022-07-13 DIAGNOSIS — R9431 Abnormal electrocardiogram [ECG] [EKG]: Secondary | ICD-10-CM | POA: Diagnosis not present

## 2022-07-13 DIAGNOSIS — I2510 Atherosclerotic heart disease of native coronary artery without angina pectoris: Secondary | ICD-10-CM | POA: Diagnosis not present

## 2022-07-13 DIAGNOSIS — G4733 Obstructive sleep apnea (adult) (pediatric): Secondary | ICD-10-CM | POA: Diagnosis not present

## 2022-07-13 DIAGNOSIS — E782 Mixed hyperlipidemia: Secondary | ICD-10-CM | POA: Diagnosis not present

## 2022-07-13 DIAGNOSIS — I10 Essential (primary) hypertension: Secondary | ICD-10-CM | POA: Diagnosis not present

## 2022-07-13 DIAGNOSIS — Z7982 Long term (current) use of aspirin: Secondary | ICD-10-CM | POA: Diagnosis not present

## 2022-08-04 DIAGNOSIS — L570 Actinic keratosis: Secondary | ICD-10-CM | POA: Diagnosis not present

## 2022-08-04 DIAGNOSIS — D2239 Melanocytic nevi of other parts of face: Secondary | ICD-10-CM | POA: Diagnosis not present

## 2022-08-04 DIAGNOSIS — L300 Nummular dermatitis: Secondary | ICD-10-CM | POA: Diagnosis not present

## 2022-08-04 DIAGNOSIS — L820 Inflamed seborrheic keratosis: Secondary | ICD-10-CM | POA: Diagnosis not present

## 2022-08-04 DIAGNOSIS — D225 Melanocytic nevi of trunk: Secondary | ICD-10-CM | POA: Diagnosis not present

## 2022-08-25 DIAGNOSIS — I2510 Atherosclerotic heart disease of native coronary artery without angina pectoris: Secondary | ICD-10-CM | POA: Diagnosis not present

## 2022-08-25 DIAGNOSIS — I10 Essential (primary) hypertension: Secondary | ICD-10-CM | POA: Diagnosis not present

## 2022-08-25 DIAGNOSIS — I358 Other nonrheumatic aortic valve disorders: Secondary | ICD-10-CM | POA: Diagnosis not present

## 2022-09-20 DIAGNOSIS — H401132 Primary open-angle glaucoma, bilateral, moderate stage: Secondary | ICD-10-CM | POA: Diagnosis not present

## 2022-10-06 DIAGNOSIS — G4733 Obstructive sleep apnea (adult) (pediatric): Secondary | ICD-10-CM | POA: Diagnosis not present

## 2022-10-06 DIAGNOSIS — M353 Polymyalgia rheumatica: Secondary | ICD-10-CM | POA: Diagnosis not present

## 2022-10-06 DIAGNOSIS — M150 Primary generalized (osteo)arthritis: Secondary | ICD-10-CM | POA: Diagnosis not present

## 2022-12-07 DIAGNOSIS — H353222 Exudative age-related macular degeneration, left eye, with inactive choroidal neovascularization: Secondary | ICD-10-CM | POA: Diagnosis not present

## 2023-01-29 DIAGNOSIS — E782 Mixed hyperlipidemia: Secondary | ICD-10-CM | POA: Diagnosis not present

## 2023-01-30 DIAGNOSIS — C778 Secondary and unspecified malignant neoplasm of lymph nodes of multiple regions: Secondary | ICD-10-CM | POA: Diagnosis not present

## 2023-02-02 DIAGNOSIS — M5136 Other intervertebral disc degeneration, lumbar region: Secondary | ICD-10-CM | POA: Diagnosis not present

## 2023-02-02 DIAGNOSIS — Z23 Encounter for immunization: Secondary | ICD-10-CM | POA: Diagnosis not present

## 2023-02-02 DIAGNOSIS — Z0000 Encounter for general adult medical examination without abnormal findings: Secondary | ICD-10-CM | POA: Diagnosis not present

## 2023-02-02 DIAGNOSIS — Z9181 History of falling: Secondary | ICD-10-CM | POA: Diagnosis not present

## 2023-02-02 DIAGNOSIS — I10 Essential (primary) hypertension: Secondary | ICD-10-CM | POA: Diagnosis not present

## 2023-02-02 DIAGNOSIS — G4733 Obstructive sleep apnea (adult) (pediatric): Secondary | ICD-10-CM | POA: Diagnosis not present

## 2023-02-02 DIAGNOSIS — C772 Secondary and unspecified malignant neoplasm of intra-abdominal lymph nodes: Secondary | ICD-10-CM | POA: Diagnosis not present

## 2023-02-02 DIAGNOSIS — E782 Mixed hyperlipidemia: Secondary | ICD-10-CM | POA: Diagnosis not present

## 2023-03-13 DIAGNOSIS — I10 Essential (primary) hypertension: Secondary | ICD-10-CM | POA: Diagnosis not present

## 2023-03-13 DIAGNOSIS — Z7982 Long term (current) use of aspirin: Secondary | ICD-10-CM | POA: Diagnosis not present

## 2023-03-13 DIAGNOSIS — G4733 Obstructive sleep apnea (adult) (pediatric): Secondary | ICD-10-CM | POA: Diagnosis not present

## 2023-03-13 DIAGNOSIS — E782 Mixed hyperlipidemia: Secondary | ICD-10-CM | POA: Diagnosis not present

## 2023-03-13 DIAGNOSIS — I2510 Atherosclerotic heart disease of native coronary artery without angina pectoris: Secondary | ICD-10-CM | POA: Diagnosis not present

## 2023-06-14 DIAGNOSIS — H353222 Exudative age-related macular degeneration, left eye, with inactive choroidal neovascularization: Secondary | ICD-10-CM | POA: Diagnosis not present

## 2023-07-02 DIAGNOSIS — C778 Secondary and unspecified malignant neoplasm of lymph nodes of multiple regions: Secondary | ICD-10-CM | POA: Diagnosis not present

## 2023-08-10 DIAGNOSIS — D225 Melanocytic nevi of trunk: Secondary | ICD-10-CM | POA: Diagnosis not present

## 2023-08-10 DIAGNOSIS — D2239 Melanocytic nevi of other parts of face: Secondary | ICD-10-CM | POA: Diagnosis not present

## 2023-08-10 DIAGNOSIS — B351 Tinea unguium: Secondary | ICD-10-CM | POA: Diagnosis not present

## 2023-08-10 DIAGNOSIS — L570 Actinic keratosis: Secondary | ICD-10-CM | POA: Diagnosis not present

## 2023-08-10 DIAGNOSIS — L853 Xerosis cutis: Secondary | ICD-10-CM | POA: Diagnosis not present

## 2023-09-07 IMAGING — CR DG CHEST 2V
2 series · 2 of 2 positions shown · non-contrast
Comparison: 02/19/2019

CLINICAL DATA: 80-year-old male with preoperative chest x-ray

EXAM:
CHEST - 2 VIEW

[w chest pa]
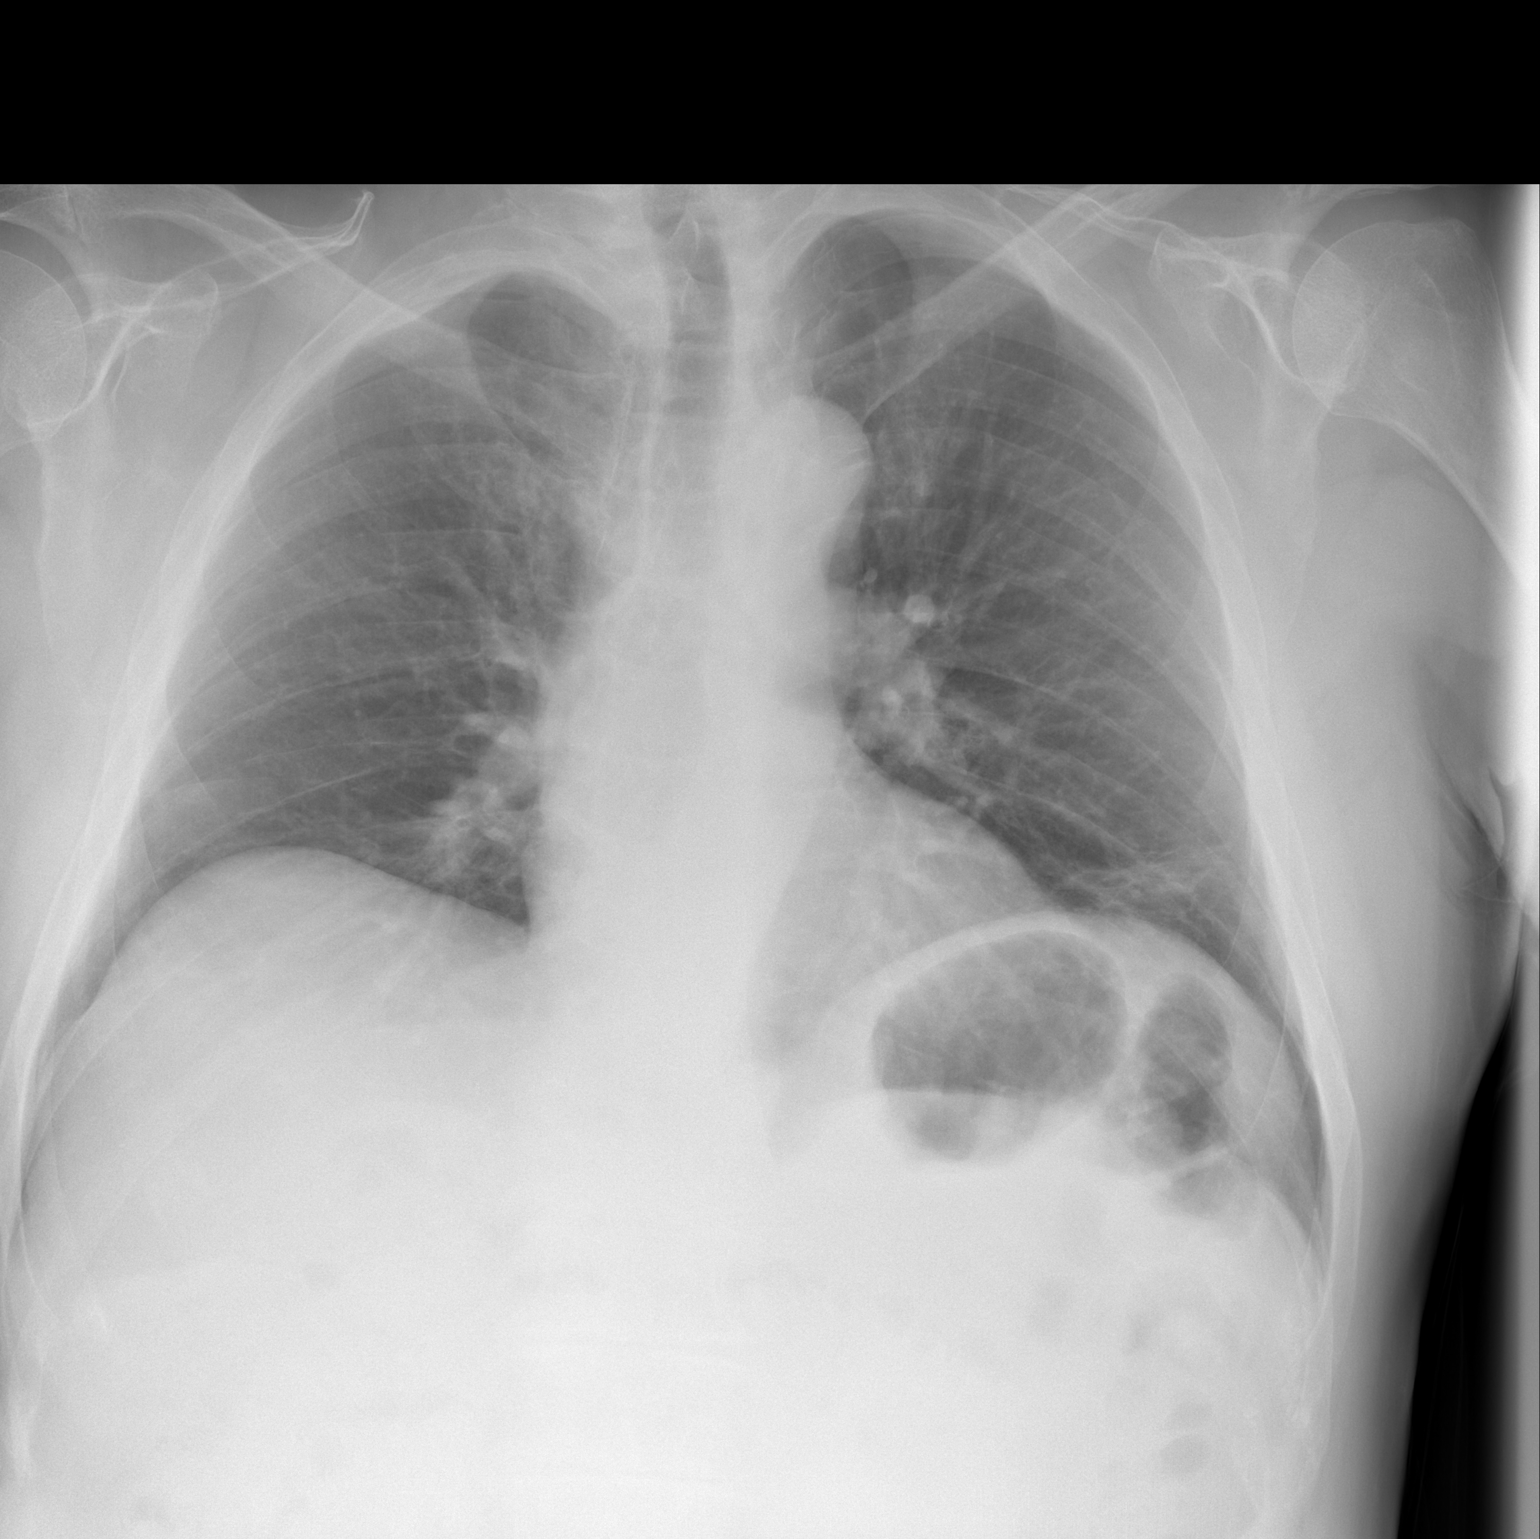

[w chest lat]
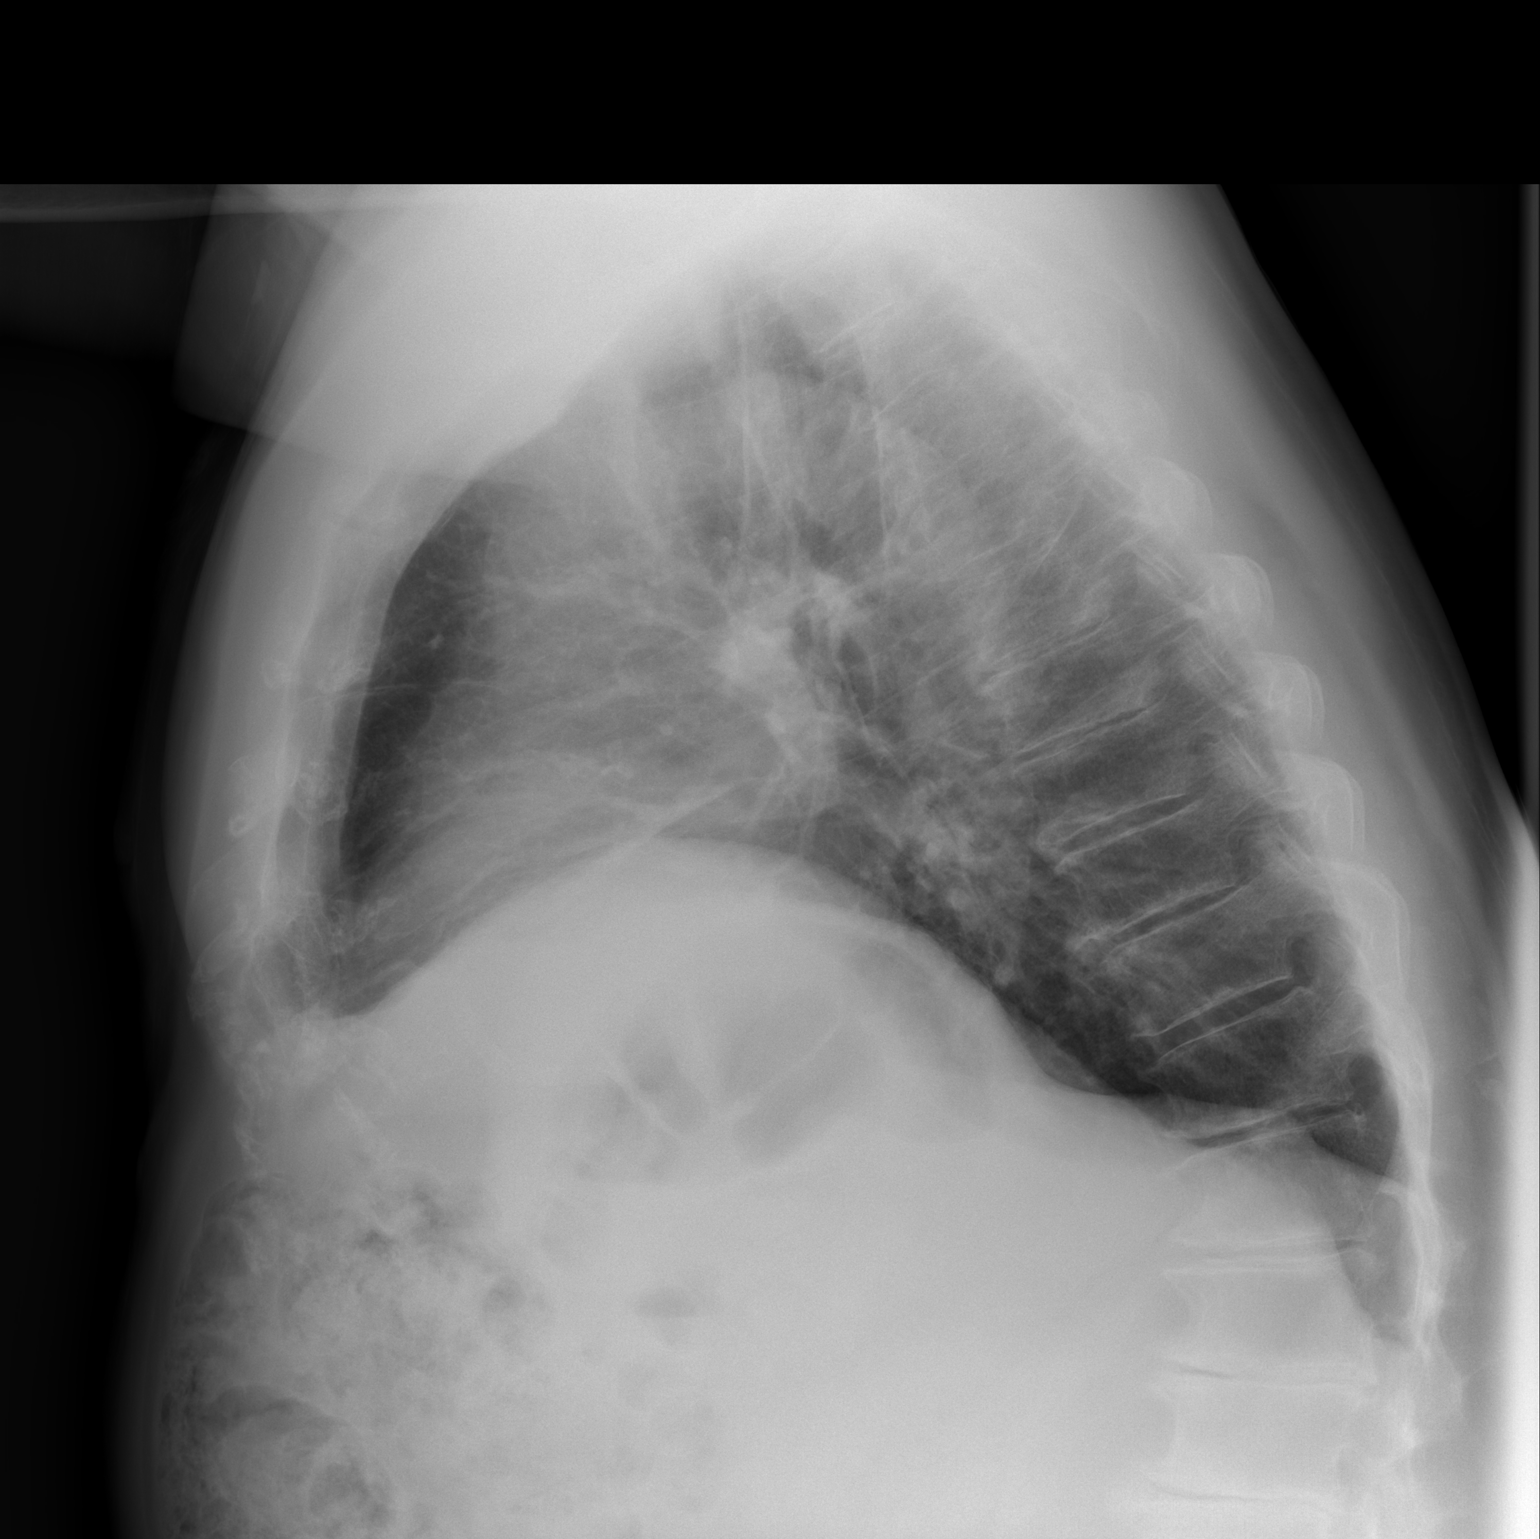

[2 of 2 positions shown; findings below may reference images not displayed]

FINDINGS: Cardiomediastinal silhouette unchanged in size and contour. No
evidence of central vascular congestion. No interlobular septal
thickening.

Low lung volumes persist with basilar atelectasis/scarring. No
confluent airspace disease. Coarsened interstitial markings.

No pneumothorax or pleural effusion.

No acute displaced fracture. Degenerative changes of the spine.
IMPRESSION: Low lung volumes without evidence of acute cardiopulmonary disease

## 2023-09-14 IMAGING — DX DG SHOULDER 1V*R*
1 series · 1 of 1 positions shown · non-contrast
Comparison: CT shoulder done on 08/24/2021

CLINICAL DATA: Status post right shoulder arthroplasty

EXAM:
RIGHT SHOULDER - 1 VIEW

[shoulder ap]
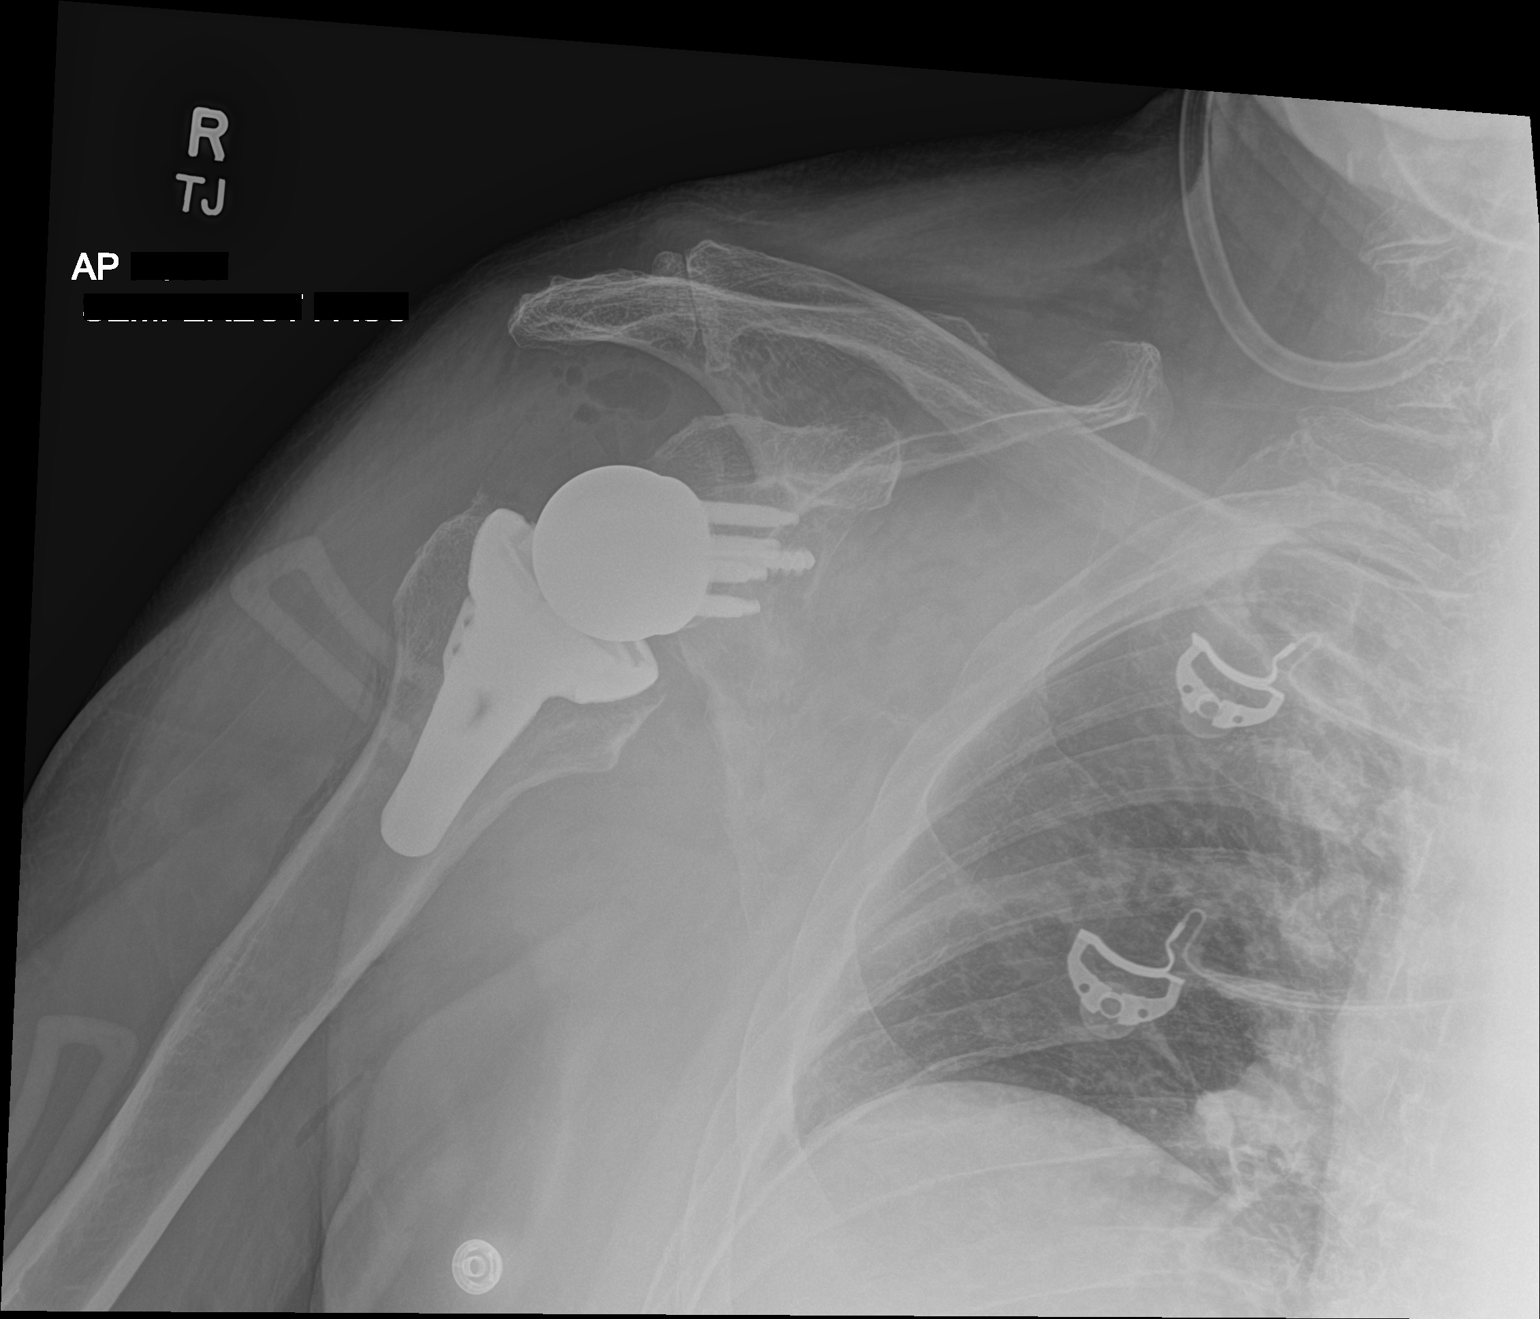

[1 of 1 positions shown; findings below may reference images not displayed]

FINDINGS: There is interval reverse arthroplasty in the right shoulder. There
are pockets of air in the soft tissues. No fracture is seen.
Degenerative changes are noted in the right AC joint.
IMPRESSION: Reverse arthroplasty is seen in the right shoulder. Degenerative
changes are noted in the right AC joint.

## 2023-10-02 DIAGNOSIS — H401132 Primary open-angle glaucoma, bilateral, moderate stage: Secondary | ICD-10-CM | POA: Diagnosis not present

## 2023-11-06 DIAGNOSIS — I2111 ST elevation (STEMI) myocardial infarction involving right coronary artery: Secondary | ICD-10-CM | POA: Diagnosis not present

## 2023-11-06 DIAGNOSIS — E782 Mixed hyperlipidemia: Secondary | ICD-10-CM | POA: Diagnosis not present

## 2023-11-06 DIAGNOSIS — Z7982 Long term (current) use of aspirin: Secondary | ICD-10-CM | POA: Diagnosis not present

## 2023-11-06 DIAGNOSIS — I10 Essential (primary) hypertension: Secondary | ICD-10-CM | POA: Diagnosis not present

## 2023-11-06 DIAGNOSIS — I2510 Atherosclerotic heart disease of native coronary artery without angina pectoris: Secondary | ICD-10-CM | POA: Diagnosis not present

## 2023-11-06 DIAGNOSIS — G4733 Obstructive sleep apnea (adult) (pediatric): Secondary | ICD-10-CM | POA: Diagnosis not present

## 2023-11-07 DIAGNOSIS — D485 Neoplasm of uncertain behavior of skin: Secondary | ICD-10-CM | POA: Diagnosis not present

## 2023-11-27 DIAGNOSIS — H401132 Primary open-angle glaucoma, bilateral, moderate stage: Secondary | ICD-10-CM | POA: Diagnosis not present

## 2023-12-31 DIAGNOSIS — C778 Secondary and unspecified malignant neoplasm of lymph nodes of multiple regions: Secondary | ICD-10-CM | POA: Diagnosis not present

## 2024-02-14 DIAGNOSIS — R609 Edema, unspecified: Secondary | ICD-10-CM | POA: Diagnosis not present

## 2024-02-14 DIAGNOSIS — I2510 Atherosclerotic heart disease of native coronary artery without angina pectoris: Secondary | ICD-10-CM | POA: Diagnosis not present

## 2024-02-14 DIAGNOSIS — I10 Essential (primary) hypertension: Secondary | ICD-10-CM | POA: Diagnosis not present

## 2024-02-26 DIAGNOSIS — M6281 Muscle weakness (generalized): Secondary | ICD-10-CM | POA: Diagnosis not present

## 2024-02-26 DIAGNOSIS — M4004 Postural kyphosis, thoracic region: Secondary | ICD-10-CM | POA: Diagnosis not present

## 2024-02-26 DIAGNOSIS — R2689 Other abnormalities of gait and mobility: Secondary | ICD-10-CM | POA: Diagnosis not present

## 2024-02-28 DIAGNOSIS — M6281 Muscle weakness (generalized): Secondary | ICD-10-CM | POA: Diagnosis not present

## 2024-02-28 DIAGNOSIS — R2689 Other abnormalities of gait and mobility: Secondary | ICD-10-CM | POA: Diagnosis not present

## 2024-02-28 DIAGNOSIS — M4004 Postural kyphosis, thoracic region: Secondary | ICD-10-CM | POA: Diagnosis not present

## 2024-03-04 DIAGNOSIS — M4004 Postural kyphosis, thoracic region: Secondary | ICD-10-CM | POA: Diagnosis not present

## 2024-03-04 DIAGNOSIS — R2689 Other abnormalities of gait and mobility: Secondary | ICD-10-CM | POA: Diagnosis not present

## 2024-03-04 DIAGNOSIS — M6281 Muscle weakness (generalized): Secondary | ICD-10-CM | POA: Diagnosis not present

## 2024-03-06 DIAGNOSIS — R2689 Other abnormalities of gait and mobility: Secondary | ICD-10-CM | POA: Diagnosis not present

## 2024-03-06 DIAGNOSIS — M4004 Postural kyphosis, thoracic region: Secondary | ICD-10-CM | POA: Diagnosis not present

## 2024-03-06 DIAGNOSIS — M6281 Muscle weakness (generalized): Secondary | ICD-10-CM | POA: Diagnosis not present

## 2024-03-10 DIAGNOSIS — R2689 Other abnormalities of gait and mobility: Secondary | ICD-10-CM | POA: Diagnosis not present

## 2024-03-10 DIAGNOSIS — M6281 Muscle weakness (generalized): Secondary | ICD-10-CM | POA: Diagnosis not present

## 2024-03-10 DIAGNOSIS — M4004 Postural kyphosis, thoracic region: Secondary | ICD-10-CM | POA: Diagnosis not present

## 2024-03-13 DIAGNOSIS — R2689 Other abnormalities of gait and mobility: Secondary | ICD-10-CM | POA: Diagnosis not present

## 2024-03-13 DIAGNOSIS — M6281 Muscle weakness (generalized): Secondary | ICD-10-CM | POA: Diagnosis not present

## 2024-03-13 DIAGNOSIS — M4004 Postural kyphosis, thoracic region: Secondary | ICD-10-CM | POA: Diagnosis not present
